# Patient Record
Sex: Female | Born: 1945 | Race: White | Hispanic: No | Marital: Single | Smoking: Former smoker
Health system: Southern US, Community
[De-identification: ages and names within clinical notes are randomized; demographics above are authoritative.]

---

## 1970-06-02 HISTORY — PX: BREAST EXCISIONAL BIOPSY: SUR124

## 1978-06-02 HISTORY — PX: CERVICAL CONIZATION W/BX: SHX1330

## 2005-11-15 ENCOUNTER — Emergency Department (HOSPITAL_COMMUNITY): Admission: EM | Admit: 2005-11-15 | Discharge: 2005-11-15 | Payer: Self-pay | Admitting: *Deleted

## 2006-03-18 ENCOUNTER — Other Ambulatory Visit: Admission: RE | Admit: 2006-03-18 | Discharge: 2006-03-18 | Payer: Self-pay | Admitting: Gynecology

## 2006-04-01 ENCOUNTER — Encounter: Admission: RE | Admit: 2006-04-01 | Discharge: 2006-04-01 | Payer: Self-pay | Admitting: Gynecology

## 2007-03-18 ENCOUNTER — Other Ambulatory Visit: Admission: RE | Admit: 2007-03-18 | Discharge: 2007-03-18 | Payer: Self-pay | Admitting: Gynecology

## 2008-04-06 ENCOUNTER — Ambulatory Visit (HOSPITAL_COMMUNITY): Admission: RE | Admit: 2008-04-06 | Discharge: 2008-04-06 | Payer: Self-pay | Admitting: Gynecology

## 2011-03-27 ENCOUNTER — Other Ambulatory Visit (HOSPITAL_COMMUNITY): Payer: Self-pay | Admitting: Gynecology

## 2011-03-27 DIAGNOSIS — Z1231 Encounter for screening mammogram for malignant neoplasm of breast: Secondary | ICD-10-CM

## 2011-04-23 ENCOUNTER — Ambulatory Visit (HOSPITAL_COMMUNITY): Payer: Self-pay | Attending: Gynecology

## 2011-10-17 ENCOUNTER — Encounter (HOSPITAL_COMMUNITY): Payer: Self-pay | Admitting: Emergency Medicine

## 2011-10-17 ENCOUNTER — Inpatient Hospital Stay (HOSPITAL_COMMUNITY)
Admission: EM | Admit: 2011-10-17 | Discharge: 2011-10-19 | DRG: 866 | Disposition: A | Discharge status: Home / Self Care | Payer: Self-pay | Attending: Internal Medicine | Admitting: Internal Medicine

## 2011-10-17 DIAGNOSIS — R21 Rash and other nonspecific skin eruption: Secondary | ICD-10-CM

## 2011-10-17 DIAGNOSIS — D696 Thrombocytopenia, unspecified: Secondary | ICD-10-CM | POA: Diagnosis present

## 2011-10-17 DIAGNOSIS — N179 Acute kidney failure, unspecified: Secondary | ICD-10-CM | POA: Diagnosis present

## 2011-10-17 DIAGNOSIS — D72829 Elevated white blood cell count, unspecified: Secondary | ICD-10-CM | POA: Diagnosis present

## 2011-10-17 DIAGNOSIS — R7401 Elevation of levels of liver transaminase levels: Secondary | ICD-10-CM | POA: Diagnosis present

## 2011-10-17 DIAGNOSIS — M109 Gout, unspecified: Secondary | ICD-10-CM | POA: Diagnosis present

## 2011-10-17 DIAGNOSIS — N289 Disorder of kidney and ureter, unspecified: Secondary | ICD-10-CM

## 2011-10-17 DIAGNOSIS — B09 Unspecified viral infection characterized by skin and mucous membrane lesions: Principal | ICD-10-CM | POA: Diagnosis present

## 2011-10-17 DIAGNOSIS — B9789 Other viral agents as the cause of diseases classified elsewhere: Secondary | ICD-10-CM | POA: Diagnosis present

## 2011-10-17 DIAGNOSIS — R7402 Elevation of levels of lactic acid dehydrogenase (LDH): Secondary | ICD-10-CM | POA: Diagnosis present

## 2011-10-17 DIAGNOSIS — B349 Viral infection, unspecified: Secondary | ICD-10-CM | POA: Diagnosis present

## 2011-10-17 DIAGNOSIS — I959 Hypotension, unspecified: Secondary | ICD-10-CM | POA: Diagnosis present

## 2011-10-17 DIAGNOSIS — E871 Hypo-osmolality and hyponatremia: Secondary | ICD-10-CM | POA: Diagnosis present

## 2011-10-17 LAB — COMPREHENSIVE METABOLIC PANEL
AST: 103 U/L — ABNORMAL HIGH (ref 0–37)
Albumin: 3 g/dL — ABNORMAL LOW (ref 3.5–5.2)
BUN: 64 mg/dL — ABNORMAL HIGH (ref 6–23)
CO2: 23 mEq/L (ref 19–32)
Calcium: 8.8 mg/dL (ref 8.4–10.5)
Creatinine, Ser: 2.04 mg/dL — ABNORMAL HIGH (ref 0.50–1.10)
GFR calc non Af Amer: 25 mL/min — ABNORMAL LOW (ref >90)

## 2011-10-17 LAB — PROTIME-INR
INR: 0.81 (ref 0.00–1.49)
Prothrombin Time: 11.4 seconds — ABNORMAL LOW (ref 11.6–15.2)

## 2011-10-17 LAB — URINALYSIS, ROUTINE W REFLEX MICROSCOPIC
Leukocytes, UA: NEGATIVE
Specific Gravity, Urine: 1.013 (ref 1.005–1.030)
Urobilinogen, UA: 0.2 mg/dL (ref 0.0–1.0)

## 2011-10-17 LAB — URINE MICROSCOPIC-ADD ON

## 2011-10-17 LAB — DIFFERENTIAL
Basophils Absolute: 0 10*3/uL (ref 0.0–0.1)
Lymphs Abs: 2.2 10*3/uL (ref 0.7–4.0)
Monocytes Absolute: 1.2 10*3/uL — ABNORMAL HIGH (ref 0.1–1.0)

## 2011-10-17 LAB — CBC
MCH: 30.7 pg (ref 26.0–34.0)
MCV: 84.6 fL (ref 78.0–100.0)
Platelets: 47 10*3/uL — ABNORMAL LOW (ref 150–400)
RDW: 14.1 % (ref 11.5–15.5)

## 2011-10-17 MED ORDER — SODIUM CHLORIDE 0.9 % IV BOLUS (SEPSIS)
500.0000 mL | Freq: Once | INTRAVENOUS | Status: AC
  Administered 2011-10-17: 500 mL via INTRAVENOUS

## 2011-10-17 NOTE — ED Notes (Signed)
[facility] NP bedside 

## 2011-10-17 NOTE — ED Provider Notes (Signed)
History     CSN: [id number]  Arrival date & time 10/17/11  1757   First MD Initiated Contact with Patient 10/17/11 2116      Chief Complaint  Patient presents with  . Fatigue    (Consider location/radiation/quality/duration/timing/severity/associated sxs/prior treatment) HPI Comments: That she's had 4 days of generalized myalgias, low-grade headache, weakness, nausea, and vomiting on Tuesday, diarrhea on Wednesday Wednesday evening.  She also had a syncopal episode while standing at the bathroom, state hitting her knee on the floor with continued symptoms, as well as a generalized non- puretic rash, including palms and soles, slightly injected.  She has slight photophobia.  Denies Tick exposure  The history is provided by the patient.    History reviewed. No pertinent past medical history.  History reviewed. No pertinent past surgical history.  Family History  Problem Relation Age of Onset  . Cancer Mother   . Cancer Father     History  Substance Use Topics  . Smoking status: Never Smoker   . Smokeless tobacco: Not on file  . Alcohol Use: 1.2 oz/week    2 Glasses of wine per week    OB History    Grav Para Term Preterm Abortions TAB SAB Ect Mult Living                  Review of Systems  Constitutional: Positive for fatigue. Negative for fever and chills.  HENT: Negative for sore throat.   Eyes: Positive for photophobia and redness. Negative for visual disturbance.  Gastrointestinal: Positive for vomiting and diarrhea.  Genitourinary: Negative for dysuria and flank pain.  Musculoskeletal: Positive for myalgias. Negative for back pain, joint swelling and gait problem.  Skin: Positive for pallor.  Neurological: Positive for weakness, light-headedness and headaches.    Allergies  Review of patient's allergies indicates no known allergies.  Home Medications   Current Outpatient Rx  Name Route Sig Dispense Refill  . VITAMIN D 1000 UNITS PO TABS Oral Take 1,000  Units by mouth daily.    Marland Kitchen NAPROXEN SODIUM 220 MG PO TABS Oral Take 220 mg by mouth 2 (two) times daily with a meal.    . PAROXETINE HCL 10 MG PO TABS Oral Take 10 mg by mouth every morning.      BP 95/61  Pulse 73  Temp(Src) 98.3 F (36.8 C) (Oral)  Resp 14  SpO2 99%  Physical Exam  Constitutional: She is oriented to person, place, and time. She appears well-developed and well-nourished.  HENT:  Head: Normocephalic.  Eyes: Right conjunctiva is injected. Left conjunctiva is injected.  Neck: Normal range of motion.  Cardiovascular: Normal rate.   Pulmonary/Chest: Effort normal.  Abdominal: Soft.  Musculoskeletal: She exhibits no edema and no tenderness.  Neurological: She is alert and oriented to person, place, and time.  Skin: Skin is warm and dry. Rash noted.    ED Course  Procedures (including critical care time)  Labs Reviewed  CBC - Abnormal; Notable for the following:    WBC 14.7 (*)    HCT 35.8 (*)    MCHC 36.3 (*)    Platelets 47 (*) REPEATED TO VERIFY   All other components within normal limits  DIFFERENTIAL - Abnormal; Notable for the following:    Neutro Abs 11.2 (*)    Monocytes Absolute 1.2 (*)    All other components within normal limits  COMPREHENSIVE METABOLIC PANEL - Abnormal; Notable for the following:    Sodium 132 (*)    Glucose,  Bld 105 (*)    BUN 64 (*)    Creatinine, Ser 2.04 (*)    Albumin 3.0 (*)    AST 103 (*)    ALT 154 (*)    GFR calc non Af Amer 25 (*)    GFR calc Af Amer 29 (*)    All other components within normal limits  URINALYSIS, ROUTINE W REFLEX MICROSCOPIC - Abnormal; Notable for the following:    APPearance CLOUDY (*)    Hgb urine dipstick SMALL (*)    All other components within normal limits  PROTIME-INR - Abnormal; Notable for the following:    Prothrombin Time 11.4 (*)    All other components within normal limits  URINE MICROSCOPIC-ADD ON   No results found.   No diagnosis found.    MDM  ITP verses viral  illness        Arman Filter, NP 10/18/11 0013  Arman Filter, NP 10/18/11 [phone]

## 2011-10-17 NOTE — ED Notes (Signed)
Lab bedside.

## 2011-10-17 NOTE — ED Notes (Signed)
Pt states she was sent here from her dr office with a low platelet count  Pt states she went to her dr today   Pt states on Tuesday she had vomiting and Wednesday she had diarrhea and states she had a syncopal episode Wed night  Pt states she has been weak and tired with a mild headache all week  Pt states she has a rash all over her body   Pt states they did labs and her plt count was 38  Pt has copy of her labwork and summary of her office visit with her

## 2011-10-17 NOTE — ED Notes (Signed)
MD at bedside. 

## 2011-10-18 ENCOUNTER — Emergency Department (HOSPITAL_COMMUNITY): Payer: Self-pay

## 2011-10-18 ENCOUNTER — Encounter (HOSPITAL_COMMUNITY): Payer: Self-pay | Admitting: Internal Medicine

## 2011-10-18 DIAGNOSIS — D7289 Other specified disorders of white blood cells: Secondary | ICD-10-CM

## 2011-10-18 DIAGNOSIS — B09 Unspecified viral infection characterized by skin and mucous membrane lesions: Secondary | ICD-10-CM

## 2011-10-18 DIAGNOSIS — I959 Hypotension, unspecified: Secondary | ICD-10-CM | POA: Diagnosis present

## 2011-10-18 DIAGNOSIS — E871 Hypo-osmolality and hyponatremia: Secondary | ICD-10-CM | POA: Diagnosis present

## 2011-10-18 DIAGNOSIS — D696 Thrombocytopenia, unspecified: Secondary | ICD-10-CM | POA: Diagnosis present

## 2011-10-18 DIAGNOSIS — D72829 Elevated white blood cell count, unspecified: Secondary | ICD-10-CM | POA: Diagnosis present

## 2011-10-18 DIAGNOSIS — N179 Acute kidney failure, unspecified: Secondary | ICD-10-CM | POA: Diagnosis present

## 2011-10-18 DIAGNOSIS — R21 Rash and other nonspecific skin eruption: Secondary | ICD-10-CM

## 2011-10-18 LAB — DIFFERENTIAL
Eosinophils Relative: 0 % (ref 0–5)
Lymphs Abs: 1.5 10*3/uL (ref 0.7–4.0)
Monocytes Relative: 6 % (ref 3–12)
Neutrophils Relative %: 83 % — ABNORMAL HIGH (ref 43–77)

## 2011-10-18 LAB — CBC
Hemoglobin: 13.1 g/dL (ref 12.0–15.0)
MCV: 83.8 fL (ref 78.0–100.0)
Platelets: 48 10*3/uL — ABNORMAL LOW (ref 150–400)
RBC: 4.33 MIL/uL (ref 3.87–5.11)
WBC: 13.9 10*3/uL — ABNORMAL HIGH (ref 4.0–10.5)

## 2011-10-18 LAB — COMPREHENSIVE METABOLIC PANEL
Albumin: 3.1 g/dL — ABNORMAL LOW (ref 3.5–5.2)
BUN: 53 mg/dL — ABNORMAL HIGH (ref 6–23)
Calcium: 8.8 mg/dL (ref 8.4–10.5)
Chloride: 98 mEq/L (ref 96–112)
Creatinine, Ser: 1.62 mg/dL — ABNORMAL HIGH (ref 0.50–1.10)
Total Bilirubin: 0.3 mg/dL (ref 0.3–1.2)
Total Protein: 6.6 g/dL (ref 6.0–8.3)

## 2011-10-18 LAB — HEPATITIS B SURFACE ANTIGEN: Hepatitis B Surface Ag: NEGATIVE

## 2011-10-18 LAB — GLUCOSE, CAPILLARY: Glucose-Capillary: 93 mg/dL (ref 70–99)

## 2011-10-18 LAB — HEPATITIS B SURFACE ANTIBODY,QUALITATIVE: Hep B S Ab: NEGATIVE

## 2011-10-18 LAB — LEGIONELLA ANTIGEN, URINE

## 2011-10-18 LAB — RPR: RPR Ser Ql: NONREACTIVE

## 2011-10-18 MED ORDER — DOXYCYCLINE HYCLATE 50 MG PO CAPS
50.0000 mg | ORAL_CAPSULE | Freq: Two times a day (BID) | ORAL | Status: DC
  Filled 2011-10-18 (×3): qty 1

## 2011-10-18 MED ORDER — DOXYCYCLINE HYCLATE 100 MG PO TABS
50.0000 mg | ORAL_TABLET | Freq: Two times a day (BID) | ORAL | Status: DC
  Administered 2011-10-18: 50 mg via ORAL
  Filled 2011-10-18 (×2): qty 0.5

## 2011-10-18 MED ORDER — ACETAMINOPHEN 650 MG RE SUPP: 650.0000 mg | Freq: Four times a day (QID) | RECTAL | Status: DC | PRN

## 2011-10-18 MED ORDER — SENNA 8.6 MG PO TABS
1.0000 | ORAL_TABLET | Freq: Two times a day (BID) | ORAL | Status: DC
  Administered 2011-10-18 – 2011-10-19 (×3): 8.6 mg via ORAL
  Filled 2011-10-18 (×3): qty 1

## 2011-10-18 MED ORDER — DOXYCYCLINE HYCLATE 100 MG PO TABS
100.0000 mg | ORAL_TABLET | Freq: Two times a day (BID) | ORAL | Status: DC
  Administered 2011-10-18 – 2011-10-19 (×3): 100 mg via ORAL
  Filled 2011-10-18 (×3): qty 1

## 2011-10-18 MED ORDER — PAROXETINE HCL 10 MG PO TABS
10.0000 mg | ORAL_TABLET | Freq: Every day | ORAL | Status: DC
  Administered 2011-10-18 – 2011-10-19 (×2): 10 mg via ORAL
  Filled 2011-10-18 (×5): qty 1

## 2011-10-18 MED ORDER — SODIUM CHLORIDE 0.9 % IV SOLN
INTRAVENOUS | Status: DC
  Administered 2011-10-18 – 2011-10-19 (×2): via INTRAVENOUS
  Filled 2011-10-18 (×3): qty 1000

## 2011-10-18 MED ORDER — ACETAMINOPHEN 325 MG PO TABS
650.0000 mg | ORAL_TABLET | Freq: Four times a day (QID) | ORAL | Status: DC | PRN
  Administered 2011-10-19: 650 mg via ORAL
  Filled 2011-10-18: qty 2

## 2011-10-18 MED ORDER — ONDANSETRON HCL 4 MG/2ML IJ SOLN: 4.0000 mg | Freq: Four times a day (QID) | INTRAMUSCULAR | Status: DC | PRN

## 2011-10-18 MED ORDER — SODIUM CHLORIDE 0.9 % IV SOLN
INTRAVENOUS | Status: DC
  Administered 2011-10-18: 150 mL/h via INTRAVENOUS
  Administered 2011-10-18: 02:00:00 via INTRAVENOUS

## 2011-10-18 MED ORDER — ONDANSETRON HCL 4 MG PO TABS: 4.0000 mg | ORAL_TABLET | Freq: Four times a day (QID) | ORAL | Status: DC | PRN

## 2011-10-18 MED ORDER — HEPARIN SODIUM (PORCINE) 5000 UNIT/ML IJ SOLN
5000.0000 [IU] | Freq: Three times a day (TID) | INTRAMUSCULAR | Status: DC
  Administered 2011-10-18 – 2011-10-19 (×5): 5000 [IU] via SUBCUTANEOUS
  Filled 2011-10-18 (×7): qty 1

## 2011-10-18 MED ORDER — DOCUSATE SODIUM 100 MG PO CAPS
100.0000 mg | ORAL_CAPSULE | Freq: Two times a day (BID) | ORAL | Status: DC
  Administered 2011-10-18 – 2011-10-19 (×3): 100 mg via ORAL
  Filled 2011-10-18 (×5): qty 1

## 2011-10-18 NOTE — Progress Notes (Signed)
Subjective: Patient reports that her symptoms started about 4 days ago first with myalgias and followed by nausea and vomiting and diarrhea the next day then a diffuse rash starting on her extremities. She states that the rash was nonpruritic and never really extended to the trunk. She went to the urgent care was sent over to the emergency room if it was noted that her platelet count was low. The patient denies any fevers. She [place] but denies noticing any ticks. Objective: Filed Vitals:   10/18/11 0232 10/18/11 0505 10/18/11 0540 10/18/11 1509  BP: 107/69 95/54 104/58 94/44  Pulse: 70 81  56  Temp: 98.2 F (36.8 C) 98.4 F (36.9 C)  97.8 F (36.6 C)  TempSrc: Oral Oral  Oral  Resp: 18 16  16   Height: 5\' 4"  (1.626 m)     Weight: 60.328 kg (133 lb)     SpO2: 96% 95%  100%   Weight change:   Intake/Output Summary (Last 24 hours) at 10/18/11 1705 Last data filed at 10/18/11 1541  Gross per 24 hour  Intake 2238.4 ml  Output    150 ml  Net 2088.4 ml    General: Alert, awake, oriented x3, in no acute distress. Well appearing HEENT: [place]/AT PEER[place], EOMI Neck: Trachea midline,  no masses, no thyromegal,y no JVD, no carotid bruit OROPHARYNX:  Moist, No exudate/ erythema/lesions.  Heart: Regular rate and rhythm, without murmurs, rubs, gallops, PMI non-displaced, no heaves or thrills on palpation.  Lungs: Clear to auscultation, no wheezing or rhonchi noted. No increased vocal fremitus resonant to percussion  Abdomen: Soft, nontender, nondistended, positive bowel sounds, no masses no hepatosplenomegaly noted..  Neuro: No focal neurological deficits noted cranial nerves II through XII grossly intact. Strength functional in bilateral upper and lower extremities. Musculoskeletal: No warm swelling or erythema around joints, no spinal tenderness noted. Psychiatric: Patient alert and oriented x3, good insight and cognition, good recent to remote recall. Skin: Patient  has non-confluent, erythematous, nonpruritic macular rash. The rash is mostly confined to the extremities and the patient reports that the rash is improved since yesterday. The rash is definitely not morbilliform in nature  Lab Results:  Basename 10/18/11 0315 10/17/11 2220  NA 133* 132*  K 3.1* 3.7  CL 98 96  CO2 21 23  GLUCOSE 98 105*  BUN 53* 64*  CREATININE 1.62* 2.04*  CALCIUM 8.8 8.8  MG -- --  PHOS -- --    Basename 10/18/11 0315 10/17/11 2220  AST 89* 103*  ALT 148* 154*  ALKPHOS 87 85  BILITOT 0.3 0.3  PROT 6.6 6.6  ALBUMIN 3.1* 3.0*   No results found for this basename: LIPASE:2,AMYLASE:2 in the last 72 hours  Basename 10/18/11 0315 10/17/11 2220  WBC 13.9* 14.7*  NEUTROABS 11.6* 11.2*  HGB 13.1 13.0  HCT 36.3 35.8*  MCV 83.8 84.6  PLT 48* 47*   No results found for this basename: CKTOTAL:3,CKMB:3,CKMBINDEX:3,TROPONINI:3 in the last 72 hours No components found with this basename: POCBNP:3 No results found for this basename: DDIMER:2 in the last 72 hours No results found for this basename: HGBA1C:2 in the last 72 hours No results found for this basename: CHOL:2,HDL:2,LDLCALC:2,TRIG:2,CHOLHDL:2,LDLDIRECT:2 in the last 72 hours No results found for this basename: TSH,T4TOTAL,FREET3,T3FREE,THYROIDAB in the last 72 hours No results found for this basename: VITAMINB12:2,FOLATE:2,FERRITIN:2,TIBC:2,IRON:2,RETICCTPCT:2 in the last 72 hours  Micro Results: No results found for this or any previous visit (from the past 240 hour(s)).  Studies/Results:  Dg Chest 1 View  10/18/2011  *RADIOLOGY REPORT*  Clinical Data: Leukocytosis, thrombocytopenia  CHEST - 1 VIEW  Comparison: None.  Findings: Cardiomediastinal silhouette is within normal limits. The lungs are clear. No pleural effusion.  No pneumothorax.  No acute osseous abnormality.  IMPRESSION: No acute cardiopulmonary process.  Original Report Authenticated By: Harrel Lemon, M.D.    Medications: I have reviewed  the patient's current medications. Scheduled Meds:   . docusate sodium  100 mg Oral BID  . doxycycline  100 mg Oral Q12H  . heparin  5,000 Units Subcutaneous Q8H  . PARoxetine  10 mg Oral Q breakfast  . senna  1 tablet Oral BID  . sodium chloride  500 mL Intravenous Once  . sodium chloride  500 mL Intravenous Once  . DISCONTD: doxycycline  50 mg Oral Q12H  . DISCONTD: doxycycline  50 mg Oral Q12H   Continuous Infusions:   . sodium chloride 0.9 % 1,000 mL with potassium chloride 40 mEq infusion 75 mL/hr at 10/18/11 1541  . DISCONTD: sodium chloride 150 mL/hr (10/18/11 0913)   PRN Meds:.acetaminophen, acetaminophen, ondansetron (ZOFRAN) IV, ondansetron Assessment/Plan: Patient Active Hospital Problem List:  Viral exanthem (10/18/2011)   Assessment: Per the patient's historical accounts appears that the patient has a viral exanthem. There is no historical data supports a tickborne illness and the patient certainly does not present with a rash indicative of measles. I have asked the internal medicine specialist to see the patient in consultation today. I will also continue the patient on doxycycline 100 mg by mouth twice a day presumptively in the event of possible early ecchymosis. I attribute the thrombocytopenia to the effects of the viral process as well such as MI this both of which are improving.     Hyponatremia (10/18/2011)   Assessment: Secondary to dehydration. Improved with IV fluid    ARF (acute renal failure) (10/18/2011)   Assessment: Patient's acute kidney injury secondary to prerenal state. This is improved with hydration and continues to improve. I suspect with the patient is fully rehydrated her renal function will defer to normal.    Transaminitis (10/18/2011)   Assessment: This is a combination of dehydrated state as well as effects of virus. This is improving we'll continue to follow.     Hypotension (10/18/2011)   Assessment: Improved with IV hydration.     LOS: 1  day

## 2011-10-18 NOTE — ED Notes (Signed)
Dr Angus Palms bedside

## 2011-10-18 NOTE — H&P (Signed)
PCP:  No primary provider on file.   Chief Complaint:  Malaise, diarrhea, rash, low plt count  HPI: 65yoF without major medical history presents with acute onset of viral- type malaise syndrome with achiness, diarrhea/vomiting, and erythematous  macular morbilliform rash, and found to have acute renal failure,  transaminitis, leukocytosis, thrombocytopenia, and relative hypotension.  Pt is excellent historian, states that she is overall healthy and was in  usual state of health including working daily until Tuesday when she  started feeling achey behind her eyes, in her neck, and diffusely in the  joints, similar to a virus or flu. She went home from work to rest and  was profoundly fatigued. Through Wednesday the most prominent symptoms  were profuse watery diarrhea, with concurrent nausea and vomiting but no  abdominal pain. Thursday, she got a bit better, other than having a  syncopal episode. However Friday am she woke up with a diffuse pink,  erythematous rash on her arms and legs for which she presented to UC,  they got labs and noted a low platelet count, and referred to ED.   She has bitten on the right upper lip by a friend's dog last Friday; she  says swelling spread to the right lip with some numbness, but this has  already resolved. She has a cat who bites and scratches her frequently.   In the ED, pt's BP is on the low side in the 90/60's which she states is  low for her, otherwise stable. Labs showed minimal hypoNa 132, renal  64/2.04. AST 103 / ALT 154. WBC 14.7 normal diff. Plts 47. Normal INR.  UA negative. Pt was given 1L of NS.   She has not been sexually active in what sounds like a long time, and  denies any h/o IVDU. She works in the office of an Set designer, at a  computer, and denies exposure to solvents, gases, etc. She has not  travelled in a long time, no 3rd world country exposures. She does work  in the garden a lot, but no known mosquito, tick  bites, no camping or  countryside exposures. She endorses having eaten a possibly undercooked  burger 1-2 weeks ago, but otherwise no strange food exposures, nothing  else raw or seafoods.   ROS: She denies frank fevers, chills, rigors, but does endorse some  diaphoresis. No frank neck stiffness but does endorse some minimal  photobphobia and phonophobia. No headache or confusion. No vision  changes, but has some conjunctival injection. No sore throat. No cough,  SOB, chest pain, palpitations. No dysuria or urinary issues. No other  rashes. The joint issues are not prominent, denies swelling, pain. She  has had poor PO intake.    History reviewed. No pertinent past medical history.  History reviewed. No pertinent past surgical history.  Medications:  HOME MEDS: Only takes prozac on daily basis Prior to Admission medications   Medication Sig Start Date End Date Taking? Authorizing Provider  cholecalciferol (VITAMIN D) 1000 UNITS tablet Take 1,000 Units by mouth daily.   Yes Historical Provider, MD  naproxen sodium (ALEVE) 220 MG tablet Take 220 mg by mouth 2 (two) times daily with a meal.   Yes Historical Provider, MD  PARoxetine (PAXIL) 10 MG tablet Take 10 mg by mouth every morning.   Yes Historical Provider, MD    Allergies:  No Known Allergies  Social History:   reports that she has never smoked. She does not have any smokeless tobacco history on  file. She reports that she drinks about 1.2 ounces of alcohol per week. She reports that she does not use illicit drugs. She lives at home and is still working in a [employer]. She is active, gardens, and doesn't use cane or walker. She is not sexually active. She smoked remotely but not heavily. Drinks 3 glasses of wine a night, occasional MJ, but no IVDU.   Family History: Family History  Problem Relation Age of Onset  . Cancer Mother   . Cancer Father     Physical Exam: Filed Vitals:   10/18/11 0115 10/18/11 0130  10/18/11 0145 10/18/11 0207  BP:    100/66  Pulse: 82   72  Temp:    98 F (36.7 C)  TempSrc:    Oral  Resp: 26 15 15 16   SpO2: 100%   98%   Blood pressure 100/66, pulse 72, temperature 98 F (36.7 C), temperature source Oral, resp. rate 16, SpO2 98.00%.  Gen: Middle aged but still young and healthy appearing F in no distress,  very pleasant and reliable historian, calm, not toxic or really even ill  appearing.  HEENT: Diffuse facial erythema, ruddiness on her cheeks, nose, extending  down to neck with diffuse blanching erythema and some telangiectasias  noted. Pupils round, reactive, equal, EOMI, sclera clear, conjunctivae a  bit injected. No nystagmus. Mouth moist and normal, no tonsillar  exudates, OP quite normal appearing. Left buccal mucosa with benign  linear gray line but also wtih a punctate gray dot above and below this  line. Right side of upper lip with a clear scratch mark, but otherwise  pretty normal appearing.  Neck: No cervical or supraclavicular LAD noted. Supple, normal Lungs: CTAB no w/c/r, good air movement, no adventitious sounds, no  increased WOB or accessory muscles, normal exam Heart: S1/2 clear, regular, and not tachy. No murmurs, no gallops or  heaves, normal exam Abd: Soft, not tender, no facial grimacing, liver edge normal, no  splenomegaly noted to deep palpation, Benign overall, bowel sounds  positive Extrem: Wamr, perfusing normally, radials easily palpable, no BLE edema  noted, normal muscle bulk and tone Neuro: Alert, attentive, conversant, CN 2-12 totally intact, face  symmetric, speech clear and fluent without aphasia, moves extremities  sponteneously with normal strength, grossly normal throughout Skin: Most prominently on her proximal BUE's and proximal thighs  bilaterally is a blanching, pink, reticular erythematous macular rash.  On the thighs they are more round and reticular but on the arms it's  more extensive and confluent. The back  is completely covered with the  rash and totally confluent. Borders are sometimes indistince, sometimes  very sharp. Legs with some small petechiae but this is not prominent.  These are not purpuric, there are no bullae. It looks akin to a drug  reaction or viral exanthem, i.e. "morbilliform." There are no oral  ulcerations I can appreciate.    Labs & Imaging Results for orders placed during the hospital encounter of 10/17/11 (from the past 48 hour(s))  URINALYSIS, ROUTINE W REFLEX MICROSCOPIC     Status: Abnormal   Collection Time   10/17/11  9:38 PM      Component Value Range Comment   Color, Urine YELLOW  YELLOW     APPearance CLOUDY (*) CLEAR     Specific Gravity, Urine 1.013  1.005 - 1.030     pH 6.0  5.0 - 8.0     Glucose, UA NEGATIVE  NEGATIVE (mg/dL)    Hgb  urine dipstick SMALL (*) NEGATIVE     Bilirubin Urine NEGATIVE  NEGATIVE     Ketones, ur NEGATIVE  NEGATIVE (mg/dL)    Protein, ur NEGATIVE  NEGATIVE (mg/dL)    Urobilinogen, UA 0.2  0.0 - 1.0 (mg/dL)    Nitrite NEGATIVE  NEGATIVE     Leukocytes, UA NEGATIVE  NEGATIVE    URINE MICROSCOPIC-ADD ON     Status: Normal   Collection Time   10/17/11  9:38 PM      Component Value Range Comment   Squamous Epithelial / LPF RARE  RARE     WBC, UA 0-2  <3 (WBC/hpf)    RBC / HPF 0-2  <3 (RBC/hpf)    Urine-Other AMORPHOUS URATES/PHOSPHATES     CBC     Status: Abnormal   Collection Time   10/17/11 10:20 PM      Component Value Range Comment   WBC 14.7 (*) 4.0 - 10.5 (K/uL)    RBC 4.23  3.87 - 5.11 (MIL/uL)    Hemoglobin 13.0  12.0 - 15.0 (g/dL)    HCT 16.1 (*) 09.6 - 46.0 (%)    MCV 84.6  78.0 - 100.0 (fL)    MCH 30.7  26.0 - 34.0 (pg)    MCHC 36.3 (*) 30.0 - 36.0 (g/dL)    RDW 04.5  40.9 - 81.1 (%)    Platelets 47 (*) 150 - 400 (K/uL) REPEATED TO VERIFY  DIFFERENTIAL     Status: Abnormal   Collection Time   10/17/11 10:20 PM      Component Value Range Comment   Neutrophils Relative 76  43 - 77 (%)    Lymphocytes Relative  15  12 - 46 (%)    Monocytes Relative 8  3 - 12 (%)    Eosinophils Relative 1  0 - 5 (%)    Basophils Relative 0  0 - 1 (%)    Neutro Abs 11.2 (*) 1.7 - 7.7 (K/uL)    Lymphs Abs 2.2  0.7 - 4.0 (K/uL)    Monocytes Absolute 1.2 (*) 0.1 - 1.0 (K/uL)    Eosinophils Absolute 0.1  0.0 - 0.7 (K/uL)    Basophils Absolute 0.0  0.0 - 0.1 (K/uL)    Smear Review PLATELET COUNT CONFIRMED BY SMEAR   LARGE PLATELETS PRESENT  COMPREHENSIVE METABOLIC PANEL     Status: Abnormal   Collection Time   10/17/11 10:20 PM      Component Value Range Comment   Sodium 132 (*) 135 - 145 (mEq/L)    Potassium 3.7  3.5 - 5.1 (mEq/L)    Chloride 96  96 - 112 (mEq/L)    CO2 23  19 - 32 (mEq/L)    Glucose, Bld 105 (*) 70 - 99 (mg/dL)    BUN 64 (*) 6 - 23 (mg/dL)    Creatinine, Ser 9.14 (*) 0.50 - 1.10 (mg/dL)    Calcium 8.8  8.4 - 10.5 (mg/dL)    Total Protein 6.6  6.0 - 8.3 (g/dL)    Albumin 3.0 (*) 3.5 - 5.2 (g/dL)    AST 782 (*) 0 - 37 (U/L)    ALT 154 (*) 0 - 35 (U/L)    Alkaline Phosphatase 85  39 - 117 (U/L)    Total Bilirubin 0.3  0.3 - 1.2 (mg/dL)    GFR calc non Af Amer 25 (*) >90 (mL/min)    GFR calc Af Amer 29 (*) >90 (mL/min)   PROTIME-INR  Status: Abnormal   Collection Time   10/17/11 10:20 PM      Component Value Range Comment   Prothrombin Time 11.4 (*) 11.6 - 15.2 (seconds)    INR 0.81  0.00 - 1.49     Dg Chest 1 View  10/18/2011  *RADIOLOGY REPORT*  Clinical Data: Leukocytosis, thrombocytopenia  CHEST - 1 VIEW  Comparison: None.  Findings: Cardiomediastinal silhouette is within normal limits. The lungs are clear. No pleural effusion.  No pneumothorax.  No acute osseous abnormality.  IMPRESSION: No acute cardiopulmonary process.  Original Report Authenticated By: Harrel Lemon, M.D.    ECG: NSR 61 bpm, normal axis, normal P and PR, no Q waves, narrow QRS,  no ST deviations, normal T waves. Normal ECG.    Impression Present on Admission:  .Morbilliform  rash .Thrombocytopenia .Leukocytosis .Hyponatremia .ARF (acute renal failure) .Transaminitis .Hypotension  65yoF without major medical history presents with acute onset of viral- type malaise syndrome with achiness, diarrhea/vomiting, and erythematous  macular morbilliform rash, and found to have acute renal failure,  transaminitis, leukocytosis, thrombocytopenia, and relative hypotension.   1. Morbilliform rash with end-organ damage: Overall, this seems  infectious in nature given the appearance of the rash. Drug reaction is  in the DDx; her only med prozac is longstanding but she did recently  take some NSAID's. Bacterial vs viral exanthem seems the most likely to  me.   The dog bite last week is concerning, she could have staph or strep  bacteremia, i.e. a toxic-shock like syndrome. Measles has been reported  in the community recently, and she has a gray spot on her buccal mucosa  (? [place]) and minimal conjuctivitis, however no coryza or cough.  Legionella (diarrhea, minimal hypoNa, and transaminitis, however no  symtpoms of PNA). Given thrombocytopenia, also consider ehrlichiosis and  anaplasmosis. Also [facility] Spotted Fever, and Southern tick- associated rash illness (STARI) -- she has many features of RMSF but the  rash is not classic in location. Given transaminitis, considered  leptospirosis. Mycoplasma infection, parvovirus and non-infectious  etiologies of Adult onset Still's disease, DRESS syndrome,  hypersensitivity vasculitis, and serum sicknesses are considered.   Others considered but less likely: Scarlet fever (No evidence of group A  strep throat), EBV (no pharyngitis, LAD makes this less likely), HIV  (not sexually active at all), syphilis.   - Consider ID consultation in the am  - Needs a differential, peripheral smear - Blood culture x2, sputum culture - CXR, EKG - UCx, legionella urinary Ag  - ANA, RPR, monospot test, parvovirus and mycoplasma  antibodies, measles  IgM - Hepatitis IgM, HepB testing, HepC testing; consider RUQ ultrasound - Ehrlichia antibody panel. ? no testing for RMSF or leptospirosis - Starting empiric doxycycline for arthropod bourne illnesses, but hold  on other broad spectrum ABx for now, given no clear Dx  - If w/u negative, consider LP? Not overwhelmed for this at present though  2. Acute renal failure: Although no clear baseline, pt is healthy and no  reason to suspect any element of CKD.  - Urine lytes for FeNa, IVF's and trend BMET  3. HypoTN: Mild; she does not look floridly shocked to me at present. As  discussed with her, other than doxycycline for now, I am going to hold  on broad spectrum ABx in order to increase diagnostic yield, but we will  have a very low threshold to start if she decompensates. She actually  looks quite well for now.  4. Thrombocytopenia:  No culprit meds. No anemia or elevated INR to  suggest a consumptive coagulopathy. Get smear, w/u as above.   5. HypoNa: Mild at present. IVF's.   SubQ heparin Regular bed given current overall stability an SBP now >100, low threshold to move SDU if more unstable.  [facility] team 5 Presumed full code    Other plans as per orders.  Prentiss Hammett 10/18/2011, 2:20 AM

## 2011-10-18 NOTE — Consult Note (Signed)
Infectious Diseases Initial Consultation  Reason for Consultation:  Viral illness with associated rash and thrombocytopenia   HPI: Eileen Stephenson is a 66 y.o. female with no significant past medical history presents with a 4 day history of flu like symptoms, predominantly myalgias, arthralgias, headache and fatigue. She noted having a day's worth of n/v followed by episode of diarrhea. She subsequently have GLF with an episode orthostasis/lightheadedness when she got out of bed to go to the bathroom. On the day of admit, she noted to have diffuse non-pruritic, macular papular rash that originally started on her legs and arms then involving her torso. Her rash does involve the palms and soles.She denies having fever, nor sore throat, nor cough. She denies any recent sick contacts. No travel. Has been outdoors, but not hiking in the woods. She denies having any recent tick exposure.  She presented to her doctor's office for evaluation who found that she had lab abn of plt 38K and was referred to ED for further evaluation.  On presentation to the ED, she was afebrile, normotensive, nor tachycardic but was found to have leukocytosis, 14.7 but also thrombocytopenia of 50K,pre-renal azotemia with cr 2.04 and transaminases 2.5X ULN. She was started empirically on doxycycline to cover possible atypical pneumonia vs. Rickettsial/RMSF/erlichia.   She states that she was bit on the lip by a dog last week, but the lesion has healed well. She states that her rash is improving. She is feeling somewhat better in the last 24hrs.  History reviewed. No pertinent past medical history.  Allergies: No Known Allergies  Current antibiotics: Doxycycline #2  MEDICATIONS:    . docusate sodium  100 mg Oral BID  . doxycycline  100 mg Oral Q12H  . heparin  5,000 Units Subcutaneous Q8H  . PARoxetine  10 mg Oral Q breakfast  . senna  1 tablet Oral BID  . sodium chloride  500 mL Intravenous Once  . sodium chloride  500 mL  Intravenous Once  . DISCONTD: doxycycline  50 mg Oral Q12H  . DISCONTD: doxycycline  50 mg Oral Q12H    History  Substance Use Topics  . Smoking status: Never Smoker   . Smokeless tobacco: Not on file  . Alcohol Use: 1.2 oz/week    2 Glasses of wine per week  - she has an office job/ works an Set designer. Planning on going to [employer] early next week.   Family History  Problem Relation Age of Onset  . Breast cancer Mother   . Cancer Father   . Leukemia Sister     Sister passed away at 6yo   Review of Systems  10 point review of systems is otherwise negative other than what is mentioned in HPI.  OBJECTIVE: Temp:  [97.8 F (36.6 C)-99 F (37.2 C)] 99 F (37.2 C) (05/18 2053) Pulse Rate:  [56-82] 76  (05/18 2053) Resp:  [15-26] 16  (05/18 2053) BP: (94-107)/(44-74) 103/58 mmHg (05/18 2053) SpO2:  [95 %-100 %] 98 % (05/18 2053) Weight:  [133 lb (60.328 kg)] 133 lb (60.328 kg) (05/18 0232) BP 103/58  Pulse 76  Temp(Src) 99 F (37.2 C) (Oral)  Resp 16  Ht 5\' 4"  (1.626 m)  Wt 133 lb (60.328 kg)  BMI 22.83 kg/m2  SpO2 98%  General Appearance:    Alert, cooperative, no distress, appears stated age  Head:    Normocephalic, without obvious abnormality, atraumatic  Eyes:    PERRL, conjunctiva/corneas clear, EOM's intact, fundi  benign, both eyes  Ears:    Normal TM's and external ear canals, both ears  Nose:   Nares normal, septum midline, mucosa normal, no drainage    or sinus tenderness  Throat:   Upper lip has healed laceration, mucosa, and tongue normal; teeth and gums normal  Neck:   Supple, symmetrical, trachea midline, no adenopathy;    thyroid:  no enlargement/tenderness/nodules; no carotid   bruit or JVD  Back:     Symmetric, no curvature, ROM normal, no CVA tenderness  Lungs:     Clear to auscultation bilaterally, respirations unlabored  Chest Wall:    No tenderness or deformity   Heart:    Regular rate and rhythm, S1 and S2 normal, no murmur, rub    or gallop     Abdomen:     Soft, non-tender, bowel sounds active all four quadrants,    no masses, no organomegaly        Extremities:   Extremities normal, atraumatic, no cyanosis or edema  Pulses:   2+ and symmetric all extremities  Skin:   Macular papular rash involving legs>arms> torso. No head involvement. Palms and soles of hands have coin like lesions.  Lymph nodes:   Cervical, supraclavicular, and axillary nodes normal  Neurologic:   CNII-XII intact, normal strength, sensation and reflexes    throughout   LABS: Results for orders placed during the hospital encounter of 10/17/11 (from the past 48 hour(s))  URINALYSIS, ROUTINE W REFLEX MICROSCOPIC     Status: Abnormal   Collection Time   10/17/11  9:38 PM      Component Value Range Comment   Color, Urine YELLOW  YELLOW     APPearance CLOUDY (*) CLEAR     Specific Gravity, Urine 1.013  1.005 - 1.030     pH 6.0  5.0 - 8.0     Glucose, UA NEGATIVE  NEGATIVE (mg/dL)    Hgb urine dipstick SMALL (*) NEGATIVE     Bilirubin Urine NEGATIVE  NEGATIVE     Ketones, ur NEGATIVE  NEGATIVE (mg/dL)    Protein, ur NEGATIVE  NEGATIVE (mg/dL)    Urobilinogen, UA 0.2  0.0 - 1.0 (mg/dL)    Nitrite NEGATIVE  NEGATIVE     Leukocytes, UA NEGATIVE  NEGATIVE    URINE MICROSCOPIC-ADD ON     Status: Normal   Collection Time   10/17/11  9:38 PM      Component Value Range Comment   Squamous Epithelial / LPF RARE  RARE     WBC, UA 0-2  <3 (WBC/hpf)    RBC / HPF 0-2  <3 (RBC/hpf)    Urine-Other AMORPHOUS URATES/PHOSPHATES     LEGIONELLA ANTIGEN, URINE     Status: Normal   Collection Time   10/17/11  9:38 PM      Component Value Range Comment   Specimen Description URINE, CLEAN CATCH      Special Requests NONE      Legionella Antigen, Urine Negative for Legionella pneumophilia serogroup 1      Report Status 10/18/2011 FINAL     CBC     Status: Abnormal   Collection Time   10/17/11 10:20 PM      Component Value Range Comment   WBC 14.7 (*) 4.0 -  10.5 (K/uL)    RBC 4.23  3.87 - 5.11 (MIL/uL)    Hemoglobin 13.0  12.0 - 15.0 (g/dL)    HCT 78.2 (*) 95.6 - 46.0 (%)    MCV  84.6  78.0 - 100.0 (fL)    MCH 30.7  26.0 - 34.0 (pg)    MCHC 36.3 (*) 30.0 - 36.0 (g/dL)    RDW 02.7  25.3 - 66.4 (%)    Platelets 47 (*) 150 - 400 (K/uL) REPEATED TO VERIFY  DIFFERENTIAL     Status: Abnormal   Collection Time   10/17/11 10:20 PM      Component Value Range Comment   Neutrophils Relative 76  43 - 77 (%)    Lymphocytes Relative 15  12 - 46 (%)    Monocytes Relative 8  3 - 12 (%)    Eosinophils Relative 1  0 - 5 (%)    Basophils Relative 0  0 - 1 (%)    Neutro Abs 11.2 (*) 1.7 - 7.7 (K/uL)    Lymphs Abs 2.2  0.7 - 4.0 (K/uL)    Monocytes Absolute 1.2 (*) 0.1 - 1.0 (K/uL)    Eosinophils Absolute 0.1  0.0 - 0.7 (K/uL)    Basophils Absolute 0.0  0.0 - 0.1 (K/uL)    Smear Review PLATELET COUNT CONFIRMED BY SMEAR   LARGE PLATELETS PRESENT  COMPREHENSIVE METABOLIC PANEL     Status: Abnormal   Collection Time   10/17/11 10:20 PM      Component Value Range Comment   Sodium 132 (*) 135 - 145 (mEq/L)    Potassium 3.7  3.5 - 5.1 (mEq/L)    Chloride 96  96 - 112 (mEq/L)    CO2 23  19 - 32 (mEq/L)    Glucose, Bld 105 (*) 70 - 99 (mg/dL)    BUN 64 (*) 6 - 23 (mg/dL)    Creatinine, Ser 4.03 (*) 0.50 - 1.10 (mg/dL)    Calcium 8.8  8.4 - 10.5 (mg/dL)    Total Protein 6.6  6.0 - 8.3 (g/dL)    Albumin 3.0 (*) 3.5 - 5.2 (g/dL)    AST 474 (*) 0 - 37 (U/L)    ALT 154 (*) 0 - 35 (U/L)    Alkaline Phosphatase 85  39 - 117 (U/L)    Total Bilirubin 0.3  0.3 - 1.2 (mg/dL)    GFR calc non Af Amer 25 (*) >90 (mL/min)    GFR calc Af Amer 29 (*) >90 (mL/min)   PROTIME-INR     Status: Abnormal   Collection Time   10/17/11 10:20 PM      Component Value Range Comment   Prothrombin Time 11.4 (*) 11.6 - 15.2 (seconds)    INR 0.81  0.00 - 1.49    RPR     Status: Normal   Collection Time   10/18/11  3:15 AM      Component Value Range Comment   RPR NON REACTIVE  NON  REACTIVE    HEPATITIS B SURFACE ANTIBODY     Status: Normal   Collection Time   10/18/11  3:15 AM      Component Value Range Comment   Hep B S Ab NEGATIVE  NEGATIVE    HEPATITIS B SURFACE ANTIGEN     Status: Normal   Collection Time   10/18/11  3:15 AM      Component Value Range Comment   Hepatitis B Surface Ag NEGATIVE  NEGATIVE    HEPATITIS C ANTIBODY (REFLEX)     Status: Normal   Collection Time   10/18/11  3:15 AM      Component Value Range Comment   HCV Ab NEGATIVE  NEGATIVE  COMPREHENSIVE METABOLIC PANEL     Status: Abnormal   Collection Time   10/18/11  3:15 AM      Component Value Range Comment   Sodium 133 (*) 135 - 145 (mEq/L)    Potassium 3.1 (*) 3.5 - 5.1 (mEq/L)    Chloride 98  96 - 112 (mEq/L)    CO2 21  19 - 32 (mEq/L)    Glucose, Bld 98  70 - 99 (mg/dL)    BUN 53 (*) 6 - 23 (mg/dL)    Creatinine, Ser 1.61 (*) 0.50 - 1.10 (mg/dL)    Calcium 8.8  8.4 - 10.5 (mg/dL)    Total Protein 6.6  6.0 - 8.3 (g/dL)    Albumin 3.1 (*) 3.5 - 5.2 (g/dL)    AST 89 (*) 0 - 37 (U/L)    ALT 148 (*) 0 - 35 (U/L)    Alkaline Phosphatase 87  39 - 117 (U/L)    Total Bilirubin 0.3  0.3 - 1.2 (mg/dL)    GFR calc non Af Amer 33 (*) >90 (mL/min)    GFR calc Af Amer 38 (*) >90 (mL/min)   CBC     Status: Abnormal   Collection Time   10/18/11  3:15 AM      Component Value Range Comment   WBC 13.9 (*) 4.0 - 10.5 (K/uL)    RBC 4.33  3.87 - 5.11 (MIL/uL)    Hemoglobin 13.1  12.0 - 15.0 (g/dL)    HCT 09.6  04.5 - 40.9 (%)    MCV 83.8  78.0 - 100.0 (fL)    MCH 30.3  26.0 - 34.0 (pg)    MCHC 36.1 (*) 30.0 - 36.0 (g/dL)    RDW 81.1  91.4 - 78.2 (%)    Platelets 48 (*) 150 - 400 (K/uL) CONSISTENT WITH PREVIOUS RESULT  DIFFERENTIAL     Status: Abnormal   Collection Time   10/18/11  3:15 AM      Component Value Range Comment   Neutrophils Relative 83 (*) 43 - 77 (%)    Lymphocytes Relative 11 (*) 12 - 46 (%)    Monocytes Relative 6  3 - 12 (%)    Eosinophils Relative 0  0 - 5 (%)     Basophils Relative 0  0 - 1 (%)    Neutro Abs 11.6 (*) 1.7 - 7.7 (K/uL)    Lymphs Abs 1.5  0.7 - 4.0 (K/uL)    Monocytes Absolute 0.8  0.1 - 1.0 (K/uL)    Eosinophils Absolute 0.0  0.0 - 0.7 (K/uL)    Basophils Absolute 0.0  0.0 - 0.1 (K/uL)    Smear Review PLATELET COUNT CONFIRMED BY SMEAR   LARGE PLATELETS PRESENT  GLUCOSE, CAPILLARY     Status: Normal   Collection Time   10/18/11  7:46 AM      Component Value Range Comment   Glucose-Capillary 93  70 - 99 (mg/dL)   SODIUM, URINE, RANDOM     Status: Normal   Collection Time   10/18/11  9:24 AM      Component Value Range Comment   Sodium, Ur 15     CREATININE, URINE, RANDOM     Status: Normal   Collection Time   10/18/11  9:24 AM      Component Value Range Comment   Creatinine, Urine 29.78     OSMOLALITY, URINE     Status: Abnormal   Collection Time   10/18/11  9:24 AM  Component Value Range Comment   Osmolality, Ur 205 (*) 390 - 1090 (mOsm/kg)     MICRO: Ur legionella NEGATIVE Strep pneumo NEGATIVE Blood cx pending  IMAGING: Dg Chest 1 View  10/18/2011  *RADIOLOGY REPORT*  Clinical Data: Leukocytosis, thrombocytopenia  CHEST - 1 VIEW  Comparison: None.  Findings: Cardiomediastinal silhouette is within normal limits. The lungs are clear. No pleural effusion.  No pneumothorax.  No acute osseous abnormality.  IMPRESSION: No acute cardiopulmonary process.  Original Report Authenticated By: Harrel Lemon, M.D.    HISTORICAL MICRO/IMAGING  Assessment/Plan:  66yo F with no prior medical history who presents with a 4 days history of flu-like symptoms and viral exanthem.  ddx likely viral infection, possibly enterovirus, coxsackie which can give you hand-foot-mouth disease. It could possibly be erlichiosis/RMSF but rash is not consistent, transaminitis would be greater.  - H & P make mention that the ddx includes measles. I strongly feel this is not measles based upon several points. 1) clinical onset of symptoms is not a  febrile rash illness associated with uri, conjunctivitis. 2) the patient is 65yo, she would likely have had measles as a child, thus having natural immunization 3) she has not had any recent travel to [place] endemic areas, nor travelled to [place]/ or part of the religious community that is affected. -- thus, droplet isolation will be discontinued.  4) agree with Dr. Ashley Royalty that this is likely viral exanthem and will be a self-limited. If she continues to improve, can discontinue doxycycline.  Will provide more recs as results from testing becomes available.  Duke Salvia Drue Second MD MPH [facility] [phone]

## 2011-10-18 NOTE — ED Provider Notes (Signed)
Medical screening examination/treatment/procedure(s) were conducted as a shared visit with non-physician practitioner(s) and myself.  I personally evaluated the patient during the encounter   Loren Racer, MD 10/18/11 [phone]

## 2011-10-18 NOTE — ED Notes (Signed)
Patient transported to X-ray 

## 2011-10-18 NOTE — ED Notes (Signed)
Adm MD bedside 

## 2011-10-19 ENCOUNTER — Encounter: Payer: Self-pay | Admitting: Internal Medicine

## 2011-10-19 ENCOUNTER — Inpatient Hospital Stay (HOSPITAL_COMMUNITY): Payer: Self-pay

## 2011-10-19 DIAGNOSIS — R740 Nonspecific elevation of levels of transaminase and lactic acid dehydrogenase [LDH]: Secondary | ICD-10-CM

## 2011-10-19 DIAGNOSIS — M109 Gout, unspecified: Secondary | ICD-10-CM

## 2011-10-19 DIAGNOSIS — B09 Unspecified viral infection characterized by skin and mucous membrane lesions: Secondary | ICD-10-CM | POA: Diagnosis present

## 2011-10-19 DIAGNOSIS — B9789 Other viral agents as the cause of diseases classified elsewhere: Secondary | ICD-10-CM

## 2011-10-19 DIAGNOSIS — B349 Viral infection, unspecified: Secondary | ICD-10-CM | POA: Diagnosis present

## 2011-10-19 LAB — DIFFERENTIAL
Basophils Absolute: 0 10*3/uL (ref 0.0–0.1)
Lymphs Abs: 1.9 10*3/uL (ref 0.7–4.0)
Monocytes Absolute: 1.7 10*3/uL — ABNORMAL HIGH (ref 0.1–1.0)

## 2011-10-19 LAB — CBC
MCH: 30.3 pg (ref 26.0–34.0)
MCV: 86.4 fL (ref 78.0–100.0)
Platelets: 79 10*3/uL — ABNORMAL LOW (ref 150–400)
RDW: 14.9 % (ref 11.5–15.5)

## 2011-10-19 LAB — URINE CULTURE
Colony Count: 10000
Culture  Setup Time: 201305180439

## 2011-10-19 LAB — COMPREHENSIVE METABOLIC PANEL
AST: 36 U/L (ref 0–37)
Albumin: 2.4 g/dL — ABNORMAL LOW (ref 3.5–5.2)
Alkaline Phosphatase: 109 U/L (ref 39–117)
Chloride: 109 mEq/L (ref 96–112)
Potassium: 4.3 mEq/L (ref 3.5–5.1)
Total Bilirubin: 0.2 mg/dL — ABNORMAL LOW (ref 0.3–1.2)

## 2011-10-19 MED ORDER — METHYLPREDNISOLONE (PAK) 4 MG PO TABS: ORAL_TABLET | ORAL | Status: AC

## 2011-10-19 MED ORDER — COLCHICINE 0.6 MG PO TABS
0.6000 mg | ORAL_TABLET | Freq: Once | ORAL | Status: AC
  Administered 2011-10-19: 0.6 mg via ORAL
  Filled 2011-10-19: qty 1

## 2011-10-19 MED ORDER — COLCHICINE 0.6 MG PO TABS
1.2000 mg | ORAL_TABLET | ORAL | Status: AC
  Administered 2011-10-19: 1.2 mg via ORAL
  Filled 2011-10-19: qty 2

## 2011-10-19 MED ORDER — DOXYCYCLINE HYCLATE 100 MG PO TABS: 100.0000 mg | ORAL_TABLET | Freq: Two times a day (BID) | ORAL | Status: DC

## 2011-10-19 NOTE — Discharge Summary (Signed)
Eileen Stephenson MRN: [id number] DOB/AGE: [birth year] 66 y.o.  Admit date: 10/17/2011 Discharge date: 10/19/2011  Primary Care Physician:  No primary provider on file.   Discharge Diagnoses:   Patient Active Problem List  Diagnoses  . Thrombocytopenia  . Leukocytosis  . Hyponatremia  . ARF (acute renal failure)  . Transaminitis  . Hypotension  . Acute gout  . Viral syndrome  . Viral exanthem    DISCHARGE MEDICATION: Medication List  As of 10/19/2011  4:37 PM   STOP taking these medications         ALEVE 220 MG tablet         TAKE these medications         cholecalciferol 1000 UNITS tablet   Commonly known as: VITAMIN D   Take 1,000 Units by mouth daily.      methylPREDNIsolone 4 MG tablet   Commonly known as: MEDROL DOSPACK   follow package directions      PARoxetine 10 MG tablet   Commonly known as: PAXIL   Take 10 mg by mouth every morning.              Consults: Infectious diseases   SIGNIFICANT DIAGNOSTIC STUDIES:  Dg Chest 1 View  10/18/2011  *RADIOLOGY REPORT*  Clinical Data: Leukocytosis, thrombocytopenia  CHEST - 1 VIEW  Comparison: None.  Findings: Cardiomediastinal silhouette is within normal limits. The lungs are clear. No pleural effusion.  No pneumothorax.  No acute osseous abnormality.  IMPRESSION: No acute cardiopulmonary process.  Original Report Authenticated By: Harrel Lemon, M.D.   Dg Foot 2 Views Right  10/19/2011  *RADIOLOGY REPORT*  Clinical Data: Swelling, pain, redness of the right first and second toes.  No known injury.  RIGHT FOOT - 2 VIEW  Comparison: None.  Findings: No fracture or dislocation.  There is mild appearance soft tissue swelling about the IP and MCP joints of the first digit.  This finding without associated significant degenerative change.  No definite hallux valgus deformity.  No radiopaque foreign body. No definite evidence of osteolysis to suggest osteomyelitis.  Minimal enthesopathic change of the Achilles tendon  insertion site.  IMPRESSION: Nonspecific minimal soft tissue swelling about the IP and MCP joints of the first digit without associated significant degenerative change.  Original Report Authenticated By: Waynard Reeds, M.D.            Recent Results (from the past 240 hour(s))  URINE CULTURE     Status: Normal   Collection Time   10/17/11  9:38 PM      Component Value Range Status Comment   Specimen Description URINE, CLEAN CATCH   Final    Special Requests NONE   Final    Culture  Setup Time [id number]   Final    Colony Count 10,000 COLONIES/ML   Final    Culture     Final    Value: Multiple bacterial morphotypes present, none predominant. Suggest appropriate recollection if clinically indicated.   Report Status 10/19/2011 FINAL   Final   CULTURE, BLOOD (ROUTINE X 2)     Status: Normal (Preliminary result)   Collection Time   10/18/11  3:15 AM      Component Value Range Status Comment   Specimen Description BLOOD RIGHT ANTECUBITAL   Final    Special Requests BOTTLES DRAWN AEROBIC ONLY [facility]   Final    Culture  Setup Time [id number]   Final    Culture     Final  Value:        BLOOD CULTURE RECEIVED NO GROWTH TO DATE CULTURE WILL BE HELD FOR 5 DAYS BEFORE ISSUING A FINAL NEGATIVE REPORT   Report Status PENDING   Incomplete   CULTURE, BLOOD (ROUTINE X 2)     Status: Normal (Preliminary result)   Collection Time   10/18/11  3:15 AM      Component Value Range Status Comment   Specimen Description BLOOD RIGHT HAND   Final    Special Requests BOTTLES DRAWN AEROBIC ONLY 5CC   Final    Culture  Setup Time [id number]   Final    Culture     Final    Value:        BLOOD CULTURE RECEIVED NO GROWTH TO DATE CULTURE WILL BE HELD FOR 5 DAYS BEFORE ISSUING A FINAL NEGATIVE REPORT   Report Status PENDING   Incomplete     BRIEF ADMITTING H & P: 66 year old without any significant medical history to present to the emergency room with a history consistent with a viral syndrome. In the  emergency room the patient was found to have dehydration, acute kidney injury, transaminitis, and thrombocytopenia. The patient also had diffuse non-confluent maculopapular rash and was admitted for further observation.   Hospital Course:  Present on Admission:  .Viral exanthem: The patient presented with a non-confluent maculopapular rash which is consistent about exanthem. Although the initial rash was described as morbilliform rash, on my examination there was no evidence of umbilical rash present when I saw the patient. The patient in my discussion and [facility] was present at the time of my examination is the same rash that was present when she presented and while she was at home. Over the course of her hospitalization the rash resolved almost completely. The patient was initially started on doxycycline however she was seen by infectious diseases and that the recommendations of doxycycline was discontinued and the patient is not discharged on any antibiotics.  .Viral syndrome: The patient presented with a history for a consistent with a viral syndrome including myalgias, vomiting, diarrhea. This led to a state of dehydration, hyponatremia and acute kidney injury. The syndrome appeared to be self-limited and improved during her hospitalization. On discharge the patient had no symptoms present consistent with the syndrome.   . Acute gout: At the time of my examination today the patient had findings of acute inflammation in the first MTP of the left lower extremity. His clinical findings very consistent with that of acute gout. X-ray of the foot was obtained which showed  Findings consistent with an inflammation. The patient was treated with colchicine for acute gout and given a Medrol Dosepak to take over the next 6 days. The patient endorsed that the pain in her left MTP had improved with the culture seen. I've advised the patient to followup with the urgent care her primary care physician  within the next week to followup on the resolution of her gout.   .Thrombocytopenia: This is felt to be associated with a viral syndrome and it started to improve over the course of hospitalization. At the time of discharge the patient had a platelet count 74,000. The patient have her CBC repeated in about a week when she is in a convalescent state.   .Leukocytosis: The patient had a mildly elevated white blood cell count with a monocytic predominance. It was felt to be associated with a viral syndrome and should have her white blood cell count repeated in about a week  when she is in a convalescent state.   .Hyponatremia: Patient had a mild hyponatremic is felt to be associated with dehydration associated with her vomiting and diarrhea. It resolved after IV fluids.   .ARF (acute renal failure): This is secondary to prerenal state associated with vomiting and diarrhea. This resolved with IV hydration. At the time of discharge the patient's creatinine was at 0.87 with a GFR of 71.   .Transaminitis: The patient had a mild transaminitis which was associated with likely more dehydration and a state of viral illness. At the time of discharge the transaminitis had almost completely resolved with only a mild elevation of her ALT.   Marland KitchenHypotension: At the time of admission the patient was mildly hypotensive with a blood pressure of 94 systolic. At the time of discharge the patient had a normal blood pressure of 121/79 and a pulse rate of 59.   Patient's condition was stable at the time of discharge.     Disposition and Follow-up:  Patient is to followup with urgent care in approximately one week watches a convalescent state. She should have her CBC and liver enzymes rechecked at that time.   Discharge Orders    Future Orders Please Complete By Expires   Diet general      Diet general      Activity as tolerated - No restrictions      Activity as tolerated - No restrictions         DISCHARGE EXAM:    General: Alert, awake, oriented x3, in no acute distress. Well appearing Vital Signs: Blood pressure 121/79, pulse 59, temperature 98.3 F (36.8 C), temperature source Oral, resp. rate 16, height 5\' 4"  (1.626 m), weight 60.328 kg (133 lb), SpO2 100.00%. HEENT: [place]/AT PEERL, EOMI  Neck: Trachea midline, no masses, no thyromegal,y no JVD, no carotid bruit  OROPHARYNX: Moist, No exudate/ erythema/lesions.  Heart: Regular rate and rhythm, without murmurs, rubs, gallops, PMI non-displaced, no heaves or thrills on palpation.  Lungs: Clear to auscultation, no wheezing or rhonchi noted. No increased vocal fremitus resonant to percussion  Abdomen: Soft, nontender, nondistended, positive bowel sounds, no masses no hepatosplenomegaly noted..  Musculoskeletal: Mild swelling and erythema around the first MTP on the left foot. Otherwise no signs of joint inflammation or deformity noted. Skin: Patient has almost completely resolved rash    Basename 10/19/11 0910 10/18/11 0315  NA 140 133*  K 4.3 3.1*  CL 109 98  CO2 21 21  GLUCOSE 126* 98  BUN 23 53*  CREATININE 0.87 1.62*  CALCIUM 8.5 8.8  MG -- --  PHOS -- --    Basename 10/19/11 0910 10/18/11 0315  AST 36 89*  ALT 84* 148*  ALKPHOS 109 87  BILITOT 0.2* 0.3  PROT 5.6* 6.6  ALBUMIN 2.4* 3.1*   No results found for this basename: LIPASE:2,AMYLASE:2 in the last 72 hours  Basename 10/19/11 0910 10/18/11 0315  WBC 12.0* 13.9*  NEUTROABS 8.3* 11.6*  HGB 10.9* 13.1  HCT 31.1* 36.3  MCV 86.4 83.8  PLT 79* 48*   Total time for discharge process including face-to-face time approximately 50 minutes. Signed: Marino Rogerson A. 10/19/2011, 4:37 PM

## 2011-10-20 LAB — HEPATITIS B CORE ANTIBODY, IGM: Hep B C IgM: NEGATIVE

## 2011-10-20 LAB — EHRLICHIA ANTIBODY PANEL: E chaffeensis (HGE) Ab, IgM: NEGATIVE

## 2011-10-20 LAB — HEPATITIS A ANTIBODY, IGM: Hep A IgM: NEGATIVE

## 2011-10-20 LAB — ANA: Anti Nuclear Antibody(ANA): NEGATIVE

## 2011-10-21 LAB — PARVOVIRUS B19 ANTIBODY, IGG AND IGM: Parovirus B19 IgM Abs: 0.1 index (ref <0.9)

## 2011-10-21 NOTE — Progress Notes (Addendum)
Patient ID: Eileen Stephenson, female   DOB: [birth year], 66 y.o.   MRN: [id number] Received a call from [facility] reporting that [facility] from [name] taken on admission 10/18/2011  showed Gram Negative Rods. Patient was discharged on 10/19/2011 in stable condition.   I spoke with [name] today who reports that she has been to work and has been feeling well. She has had no fevers or chills and states that she has not been feeling poorly. Pt also states that her gout in her great toe has improved considerably. Pt is on a medrol taper which could potentially prevent a fever.   I  called Dr. Dagoberto Reef office at [facility]  [phone] and discussed this with Dr. Lillia Dallas who will follow up on the cultures.   I have instructed [name] to go to the [facility] to receive a depot shot of IM Rocephin today until the identification  and susceptibilities are obtained.  I have also spoken to Darl Pikes at [facility] at [phone] and she will note in their records that [facility]  Will follow up on labs. I have also given the office phone number to the office for results to be called to Dr. Lillia Dallas.  Zaevion Parke A. B# [id number]

## 2011-10-24 LAB — CULTURE, BLOOD (ROUTINE X 2)
Culture  Setup Time: 201305181142
Culture: NO GROWTH

## 2011-11-04 LAB — CULTURE, BLOOD (ROUTINE X 2): Culture  Setup Time: 201305181142

## 2011-11-10 LAB — PATHOLOGIST SMEAR REVIEW

## 2012-03-24 ENCOUNTER — Other Ambulatory Visit (HOSPITAL_COMMUNITY): Payer: Self-pay | Admitting: Gynecology

## 2012-03-24 DIAGNOSIS — M949 Disorder of cartilage, unspecified: Secondary | ICD-10-CM

## 2012-08-10 ENCOUNTER — Encounter: Payer: Self-pay | Admitting: *Deleted

## 2012-08-10 NOTE — Telephone Encounter (Signed)
error 

## 2013-05-05 IMAGING — CR DG FOOT 2V*R*
1 series · 2 of 2 positions shown · non-contrast
Comparison: None.

CLINICAL DATA: Swelling, pain, redness of the right first and
second toes.  No known injury.

RIGHT FOOT - 2 VIEW

[Series 1: AP · left · 2 of 2 slices shown]
[im 1/2]
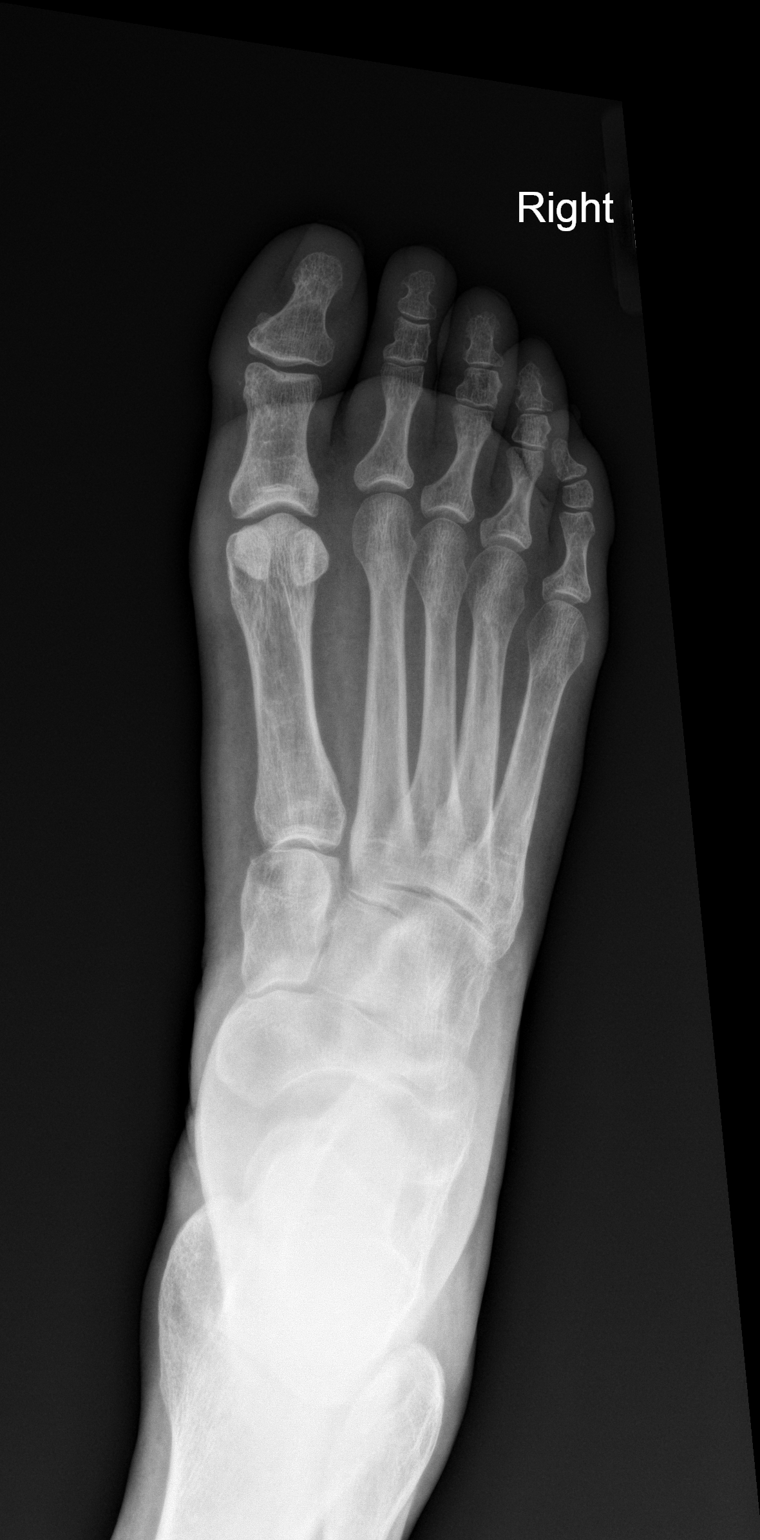
[im 2/2]
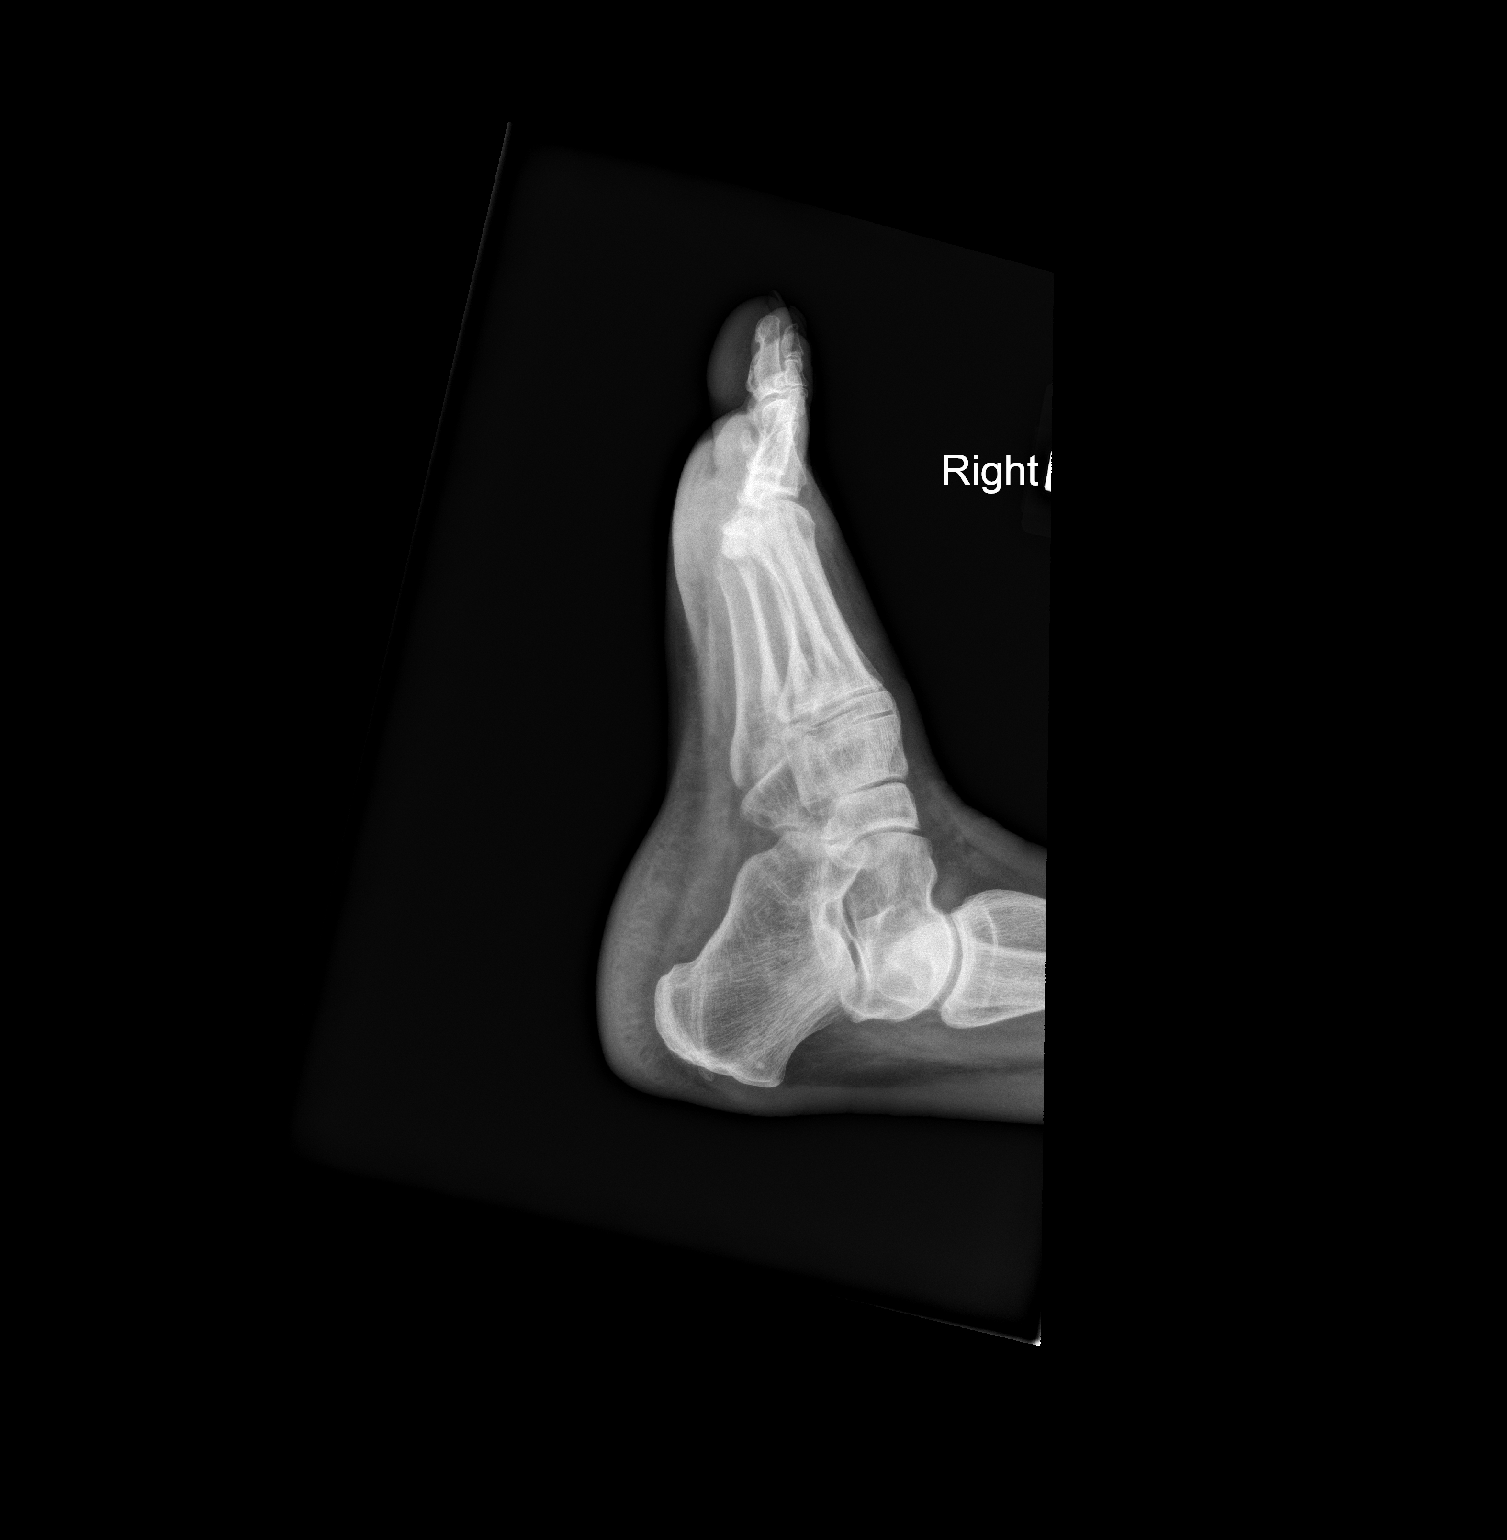

[2 of 2 positions shown; findings below may reference images not displayed]

FINDINGS: No fracture or dislocation.  There is mild appearance
soft tissue swelling about the IP and MCP joints of the first
digit.  This finding without associated significant degenerative
change.  No definite hallux valgus deformity.  No radiopaque
foreign body. No definite evidence of osteolysis to suggest
osteomyelitis.  Minimal enthesopathic change of the Achilles tendon
insertion site.
IMPRESSION: Nonspecific minimal soft tissue swelling about the IP and MCP
joints of the first digit without associated significant
degenerative change.

## 2013-05-11 ENCOUNTER — Encounter (HOSPITAL_COMMUNITY): Payer: Self-pay | Admitting: Emergency Medicine

## 2013-05-11 ENCOUNTER — Emergency Department (INDEPENDENT_AMBULATORY_CARE_PROVIDER_SITE_OTHER)
Admission: EM | Admit: 2013-05-11 | Discharge: 2013-05-11 | Disposition: A | Discharge status: Home / Self Care | Payer: Medicare Other | Source: Home / Self Care

## 2013-05-11 ENCOUNTER — Emergency Department (INDEPENDENT_AMBULATORY_CARE_PROVIDER_SITE_OTHER): Payer: Medicare Other

## 2013-05-11 DIAGNOSIS — S93409A Sprain of unspecified ligament of unspecified ankle, initial encounter: Secondary | ICD-10-CM

## 2013-05-11 DIAGNOSIS — S93402A Sprain of unspecified ligament of left ankle, initial encounter: Secondary | ICD-10-CM

## 2013-05-11 NOTE — ED Provider Notes (Signed)
Eileen Stephenson is a 67 y.o. female who presents to Urgent Care today for left ankle injury with bruising. She reports that 12/5 she was dancing and turned in left ankle. It has been painful to walk on but not impossible. She has had significant bruising. Pain is not present unless she turns ankle the wrong way. Otherwise, she is able to ambulate on it and denies significant pain.    History reviewed. No pertinent past medical history. History  Substance Use Topics  . Smoking status: Never Smoker   . Smokeless tobacco: Not on file  . Alcohol Use: 1.2 oz/week    2 Glasses of wine per week   ROS as above Medications reviewed. No current facility-administered medications for this encounter.   Current Outpatient Prescriptions  Medication Sig Dispense Refill  . cholecalciferol (VITAMIN D) 1000 UNITS tablet Take 1,000 Units by mouth daily.      Marland Kitchen PARoxetine (PAXIL) 10 MG tablet Take 10 mg by mouth every morning.        Exam:  BP 136/91  Pulse 69  Temp(Src) 97.3 F (36.3 C) (Oral)  Resp 20  SpO2 96% Gen: Well NAD HEENT: EOMI Lungs: Normal work of breathing. Exts: Left foot 2+ pitting edema up to ankle, bluish yellow discoloration anterior foot and lateral heel, mild tenderness to palpation, 2+ DP pulse, mildly stiff ROM but able to dorsiflex and plantar flex against resistance. No point tenderness at head of fibula.  Ambulation is very slow and antalgic gait.   No results found for this or any previous visit (from the past 24 hour(s)). Dg Ankle Complete Left  05/11/2013   CLINICAL DATA:  Bruising  EXAM: LEFT ANKLE COMPLETE - 3+ VIEW  COMPARISON:  None.  FINDINGS: Three views of the left ankle submitted. Ankle mortise is preserved. There is subtle cortical irregularity distal left fibula. Subtle nondisplaced fracture cannot be excluded. Clinical correlation is necessary. Soft tissue swelling adjacent to lateral malleolus.  IMPRESSION: Ankle mortise is preserved. There is subtle cortical  irregularity distal left fibula. Subtle nondisplaced fracture cannot be excluded. Clinical correlation is necessary. Soft tissue swelling adjacent to lateral malleolus.   Electronically Signed   By: Natasha Mead M.D.   On: 05/11/2013 16:39    Assessment and Plan: 67 y.o. female with left ankle injury and bruising, preserved ROM but mild pain with ambulation, antalgic gait, some tenderness on palpation throughout, and questionable xray finding. No obvious point tenderness at questionable area on xray. - Will conservatively treat with nonweight bearing, CAM walker, and advising patient to follow up with orthopedics. Has crutches at home. - Pain control with Ibuprofen PRN. - Keep elevated, move ankle  # Elevated BP - on recheck, 136/91. - F/u with PCP.  Leona Singleton, MD   Leona Singleton, MD 05/11/13 [phone]

## 2013-05-11 NOTE — ED Notes (Signed)
Pt c/o left ankle inj onset Friday States she was dancing when she twisted her ankle Sxs include: tender, swelling, and bruising She is alert w/no signs of acute distress.

## 2013-05-12 NOTE — ED Provider Notes (Signed)
Medical screening examination/treatment/procedure(s) were performed by a resident physician or non-physician practitioner and as the supervising physician I was immediately available for consultation/collaboration.  Dymond Spreen, MD    Aldyn Toon S Caliope Ruppert, MD 05/12/13 1017 

## 2013-07-21 ENCOUNTER — Other Ambulatory Visit: Payer: Self-pay | Admitting: Gynecology

## 2013-07-21 DIAGNOSIS — M8580 Other specified disorders of bone density and structure, unspecified site: Secondary | ICD-10-CM

## 2014-07-12 ENCOUNTER — Emergency Department (INDEPENDENT_AMBULATORY_CARE_PROVIDER_SITE_OTHER)
Admission: EM | Admit: 2014-07-12 | Discharge: 2014-07-12 | Disposition: A | Discharge status: Home / Self Care | Payer: PPO | Source: Home / Self Care | Attending: Family Medicine | Admitting: Family Medicine

## 2014-07-12 ENCOUNTER — Encounter (HOSPITAL_COMMUNITY): Payer: Self-pay | Admitting: Emergency Medicine

## 2014-07-12 ENCOUNTER — Emergency Department (INDEPENDENT_AMBULATORY_CARE_PROVIDER_SITE_OTHER): Payer: PPO

## 2014-07-12 DIAGNOSIS — S52501A Unspecified fracture of the lower end of right radius, initial encounter for closed fracture: Secondary | ICD-10-CM

## 2014-07-12 NOTE — ED Provider Notes (Signed)
Eileen Stephenson is a 69 y.o. female who presents to Urgent Care today for right wrist injury. Patient slipped and fell landing onto her outstretched right wrist today just prior to presentation. She notes pain and deformity. No radiating pain weakness or numbness. She feels well otherwise.   History reviewed. No pertinent past medical history. History reviewed. No pertinent past surgical history. History  Substance Use Topics  . Smoking status: Never Smoker   . Smokeless tobacco: Not on file  . Alcohol Use: 1.2 oz/week    2 Glasses of wine per week   ROS as above Medications: No current facility-administered medications for this encounter.   Current Outpatient Prescriptions  Medication Sig Dispense Refill  . cholecalciferol (VITAMIN D) 1000 UNITS tablet Take 1,000 Units by mouth daily.    Marland Kitchen PARoxetine (PAXIL) 10 MG tablet Take 10 mg by mouth every morning.     No Known Allergies   Exam:  BP 134/74 mmHg  Pulse 70  Temp(Src) 98.6 F (37 C) (Oral)  Resp 18  SpO2 98% Gen: Well NAD Right arm: elbow nontender normal motion Wrist obvious deformity at the distal radius and ulna. Pulses capillary refill sensation are intact. Sensation intact throughout. Normal hand motion..   No results found for this or any previous visit (from the past 24 hour(s)). Dg Wrist Complete Right  07/12/2014   CLINICAL DATA:  [place] [address] tonight and injured right wrist.  EXAM: RIGHT WRIST - COMPLETE 3+ VIEW  COMPARISON:  None.  FINDINGS: There is a comminuted dorsally impacted intra-articular distal radius fracture (collies fracture). There is also a ulnar styloid fracture. The carpal and metacarpal bones are intact.  IMPRESSION: Comminuted intra-articular dorsally impacted distal radius fracture (collies fracture).  Ulnar styloid fracture.   Electronically Signed   By: Marijo Sanes M.D.   On: 07/12/2014 20:10    Assessment and Plan: 69 y.o. female with distal radius fracture. Appears to be displaced impacted and  comminuted. Patient was placed into a well formed sugar tong splint and will follow-up with hand surgery in the near future. NSAIDs for pain control.  Discussed warning signs or symptoms. Please see discharge instructions. Patient expresses understanding.     Gregor Hams, MD 07/12/14 2035

## 2014-07-12 NOTE — Discharge Instructions (Signed)
Thank you for coming in today. Take ibuprofen or naproxen as needed for pain Follow-up with hand surgery.   Wrist Fracture A wrist fracture is a break or crack in one of the bones of your wrist. Your wrist is made up of eight small bones at the palm of your hand (carpal bones) and two long bones that make up your forearm (radius and ulna).  CAUSES   A direct blow to the wrist.  Falling on an outstretched hand.  Trauma, such as a car accident or a fall. RISK FACTORS Risk factors for wrist fracture include:   Participating in contact and high-risk sports, such as skiing, biking, and ice skating.  Taking steroid medicines.  Smoking.  Being female.  Being Caucasian.  Drinking more than three alcoholic beverages per day.  Having low or lowered bone density (osteoporosis or osteopenia).  Age. Older adults have decreased bone density.  Women who have had menopause.  History of previous fractures. SIGNS AND SYMPTOMS Symptoms of wrist fractures include tenderness, bruising, and inflammation. Additionally, the wrist may hang in an odd position or appear deformed.  DIAGNOSIS Diagnosis may include:  Physical exam.  X-ray. TREATMENT Treatment depends on many factors, including the nature and location of the fracture, your age, and your activity level. Treatment for wrist fracture can be nonsurgical or surgical.  Nonsurgical Treatment A plaster cast or splint may be applied to your wrist if the bone is in a good position. If the fracture is not in good position, it may be necessary for your health care provider to realign it before applying a splint or cast. Usually, a cast or splint will be worn for several weeks.  Surgical Treatment Sometimes the position of the bone is so far out of place that surgery is required to apply a device to hold it together as it heals. Depending on the fracture, there are a number of options for holding the bone in place while it heals, such as a cast  and metal pins.  HOME CARE INSTRUCTIONS  Keep your injured wrist elevated and move your fingers as much as possible.  Do not put pressure on any part of your cast or splint. It may break.   Use a plastic bag to protect your cast or splint from water while bathing or showering. Do not lower your cast or splint into water.  Take medicines only as directed by your health care provider.  Keep your cast or splint clean and dry. If it becomes wet, damaged, or suddenly feels too tight, contact your health care provider right away.  Do not use any tobacco products including cigarettes, chewing tobacco, or electronic cigarettes. Tobacco can delay bone healing. If you need help quitting, ask your health care provider.  Keep all follow-up visits as directed by your health care provider. This is important.  Ask your health care provider if you should take supplements of calcium and vitamins C and D to promote bone healing. SEEK MEDICAL CARE IF:   Your cast or splint is damaged, breaks, or gets wet.  You have a fever.  You have chills.  You have continued severe pain or more swelling than you did before the cast was put on. SEEK IMMEDIATE MEDICAL CARE IF:   Your hand or fingernails on the injured arm turn blue or gray, or feel cold or numb.  You have decreased feeling in the fingers of your injured arm. MAKE SURE YOU:  Understand these instructions.  Will watch your condition.  Will get help right away if you are not doing well or get worse. Document Released: 02/26/2005 Document Revised: 10/03/2013 Document Reviewed: 06/06/2011 [facility] Patient Information 2015 [place], [place]. This information is not intended to replace advice given to you by your health care provider. Make sure you discuss any questions you have with your health care provider.    Cast or Splint Care Casts and splints support injured limbs and keep bones from moving while they heal. It is important to care for  your cast or splint at home.  HOME CARE INSTRUCTIONS  Keep the cast or splint uncovered during the drying period. It can take 24 to 48 hours to dry if it is made of plaster. A fiberglass cast will dry in less than 1 hour.  Do not rest the cast on anything harder than a pillow for the first 24 hours.  Do not put weight on your injured limb or apply pressure to the cast until your health care provider gives you permission.  Keep the cast or splint dry. Wet casts or splints can lose their shape and may not support the limb as well. A wet cast that has lost its shape can also create harmful pressure on your skin when it dries. Also, wet skin can become infected.  Cover the cast or splint with a plastic bag when bathing or when out in the rain or snow. If the cast is on the trunk of the body, take sponge baths until the cast is removed.  If your cast does become wet, dry it with a towel or a blow dryer on the cool setting only.  Keep your cast or splint clean. Soiled casts may be wiped with a moistened cloth.  Do not place any hard or soft foreign objects under your cast or splint, such as cotton, toilet paper, lotion, or powder.  Do not try to scratch the skin under the cast with any object. The object could get stuck inside the cast. Also, scratching could lead to an infection. If itching is a problem, use a blow dryer on a cool setting to relieve discomfort.  Do not trim or cut your cast or remove padding from inside of it.  Exercise all joints next to the injury that are not immobilized by the cast or splint. For example, if you have a long leg cast, exercise the hip joint and toes. If you have an arm cast or splint, exercise the shoulder, elbow, thumb, and fingers.  Elevate your injured arm or leg on 1 or 2 pillows for the first 1 to 3 days to decrease swelling and pain.It is best if you can comfortably elevate your cast so it is higher than your heart. SEEK MEDICAL CARE IF:   Your cast  or splint cracks.  Your cast or splint is too tight or too loose.  You have unbearable itching inside the cast.  Your cast becomes wet or develops a soft spot or area.  You have a bad smell coming from inside your cast.  You get an object stuck under your cast.  Your skin around the cast becomes red or raw.  You have new pain or worsening pain after the cast has been applied. SEEK IMMEDIATE MEDICAL CARE IF:   You have fluid leaking through the cast.  You are unable to move your fingers or toes.  You have discolored (blue or white), cool, painful, or very swollen fingers or toes beyond the cast.  You have tingling or numbness  around the injured area.  You have severe pain or pressure under the cast.  You have any difficulty with your breathing or have shortness of breath.  You have chest pain. Document Released: 05/16/2000 Document Revised: 03/09/2013 Document Reviewed: 11/25/2012 [facility] Patient Information 2015 [place], [place]. This information is not intended to replace advice given to you by your health care provider. Make sure you discuss any questions you have with your health care provider.

## 2014-07-12 NOTE — ED Notes (Signed)
Pt tripped over a box and fell backwards where she landed on her wrist.  Pt is in 10/10 pain at the wrist.  She is in no distress

## 2015-06-11 DIAGNOSIS — F339 Major depressive disorder, recurrent, unspecified: Secondary | ICD-10-CM | POA: Diagnosis not present

## 2016-09-30 DIAGNOSIS — Z23 Encounter for immunization: Secondary | ICD-10-CM | POA: Diagnosis not present

## 2016-09-30 DIAGNOSIS — Z136 Encounter for screening for cardiovascular disorders: Secondary | ICD-10-CM | POA: Diagnosis not present

## 2016-09-30 DIAGNOSIS — Z1159 Encounter for screening for other viral diseases: Secondary | ICD-10-CM | POA: Diagnosis not present

## 2016-09-30 DIAGNOSIS — Z0000 Encounter for general adult medical examination without abnormal findings: Secondary | ICD-10-CM | POA: Diagnosis not present

## 2016-09-30 DIAGNOSIS — Z1231 Encounter for screening mammogram for malignant neoplasm of breast: Secondary | ICD-10-CM | POA: Diagnosis not present

## 2016-09-30 DIAGNOSIS — Z1283 Encounter for screening for malignant neoplasm of skin: Secondary | ICD-10-CM | POA: Diagnosis not present

## 2016-09-30 DIAGNOSIS — Z13220 Encounter for screening for lipoid disorders: Secondary | ICD-10-CM | POA: Diagnosis not present

## 2016-09-30 DIAGNOSIS — N8110 Cystocele, unspecified: Secondary | ICD-10-CM | POA: Diagnosis not present

## 2016-09-30 DIAGNOSIS — Z124 Encounter for screening for malignant neoplasm of cervix: Secondary | ICD-10-CM | POA: Diagnosis not present

## 2016-10-09 DIAGNOSIS — D224 Melanocytic nevi of scalp and neck: Secondary | ICD-10-CM | POA: Diagnosis not present

## 2016-10-09 DIAGNOSIS — Z1283 Encounter for screening for malignant neoplasm of skin: Secondary | ICD-10-CM | POA: Diagnosis not present

## 2016-10-09 DIAGNOSIS — D225 Melanocytic nevi of trunk: Secondary | ICD-10-CM | POA: Diagnosis not present

## 2016-10-09 DIAGNOSIS — D485 Neoplasm of uncertain behavior of skin: Secondary | ICD-10-CM | POA: Diagnosis not present

## 2016-10-15 ENCOUNTER — Other Ambulatory Visit: Payer: Self-pay | Admitting: Nurse Practitioner

## 2016-10-15 DIAGNOSIS — Z1231 Encounter for screening mammogram for malignant neoplasm of breast: Secondary | ICD-10-CM

## 2016-10-30 ENCOUNTER — Ambulatory Visit
Admission: RE | Admit: 2016-10-30 | Discharge: 2016-10-30 | Disposition: A | Discharge status: Home / Self Care | Payer: PPO | Source: Ambulatory Visit | Attending: Nurse Practitioner | Admitting: Nurse Practitioner

## 2016-10-30 DIAGNOSIS — Z1231 Encounter for screening mammogram for malignant neoplasm of breast: Secondary | ICD-10-CM | POA: Diagnosis not present

## 2017-05-25 ENCOUNTER — Encounter (HOSPITAL_COMMUNITY): Payer: Self-pay | Admitting: *Deleted

## 2017-05-25 ENCOUNTER — Ambulatory Visit (HOSPITAL_COMMUNITY)
Admission: EM | Admit: 2017-05-25 | Discharge: 2017-05-25 | Disposition: A | Discharge status: Home / Self Care | Payer: PPO | Attending: Internal Medicine | Admitting: Internal Medicine

## 2017-05-25 ENCOUNTER — Other Ambulatory Visit: Payer: Self-pay

## 2017-05-25 ENCOUNTER — Ambulatory Visit (INDEPENDENT_AMBULATORY_CARE_PROVIDER_SITE_OTHER): Payer: PPO

## 2017-05-25 DIAGNOSIS — W19XXXA Unspecified fall, initial encounter: Secondary | ICD-10-CM | POA: Diagnosis not present

## 2017-05-25 DIAGNOSIS — S62616A Displaced fracture of proximal phalanx of right little finger, initial encounter for closed fracture: Secondary | ICD-10-CM

## 2017-05-25 NOTE — ED Notes (Signed)
Anderson Malta from [employer] has been notified.

## 2017-05-25 NOTE — ED Notes (Signed)
Ortho has been paged. 

## 2017-05-25 NOTE — ED Provider Notes (Addendum)
[place]    CSN: [id number] Arrival date & time: 05/25/17  1021     History   Chief Complaint Chief Complaint  Patient presents with  . Hand Injury    HPI Eileen Stephenson is a 71 y.o. female.   Eileen Stephenson presents with complaints of right pinky finger pain and swelling after a fall a few days- a week ago. She slipped and fell forward, landing directly on her 5th digit. She has had pain and swelling since. Denies numbness or tingling. Has been taking ibuprofen and tylenol which have helped. She is right handed. Rates pain 8/10 only if struck or with movement. At rest does not have pain. She works in [employer]. History of right wrist fracture with hardware in place.    ROS per HPI.       History reviewed. No pertinent past medical history.  Patient Active Problem List   Diagnosis Date Noted  . Acute gout 10/19/2011  . Viral syndrome 10/19/2011  . Viral exanthem 10/19/2011  . Thrombocytopenia ([place]) 10/18/2011  . Leukocytosis 10/18/2011  . Hyponatremia 10/18/2011  . ARF (acute renal failure) ([place]) 10/18/2011  . Transaminitis 10/18/2011  . Hypotension 10/18/2011    Past Surgical History:  Procedure Laterality Date  . BREAST EXCISIONAL BIOPSY Right 1972    OB History    No data available       Home Medications    Prior to Admission medications   Medication Sig Start Date End Date Taking? Authorizing Provider  cholecalciferol (VITAMIN D) 1000 UNITS tablet Take 1,000 Units by mouth daily.   Yes [provider]  PARoxetine (PAXIL) 10 MG tablet Take 10 mg by mouth every morning.   Yes [provider]    Family History Family History  Problem Relation Age of Onset  . Breast cancer Mother 56  . Cancer Father   . Leukemia Sister        Sister passed away at [place] Use  . Smoking status: Never Smoker  . Smokeless tobacco: Never Used  Substance Use Topics  . Alcohol use: Yes   Alcohol/week: 1.2 oz    Types: 2 Glasses of wine per week  . Drug use: No     Allergies   Patient has no known allergies.   Review of Systems Review of Systems   Physical Exam Triage Vital Signs ED Triage Vitals  Enc Vitals Group     BP 05/25/17 1052 (!) 141/90     Pulse Rate 05/25/17 1052 61     Resp --      Temp 05/25/17 1052 98.2 F (36.8 C)     Temp Source 05/25/17 1052 Oral     SpO2 05/25/17 1052 100 %     Weight --      Height --      Head Circumference --      Peak Flow --      Pain Score 05/25/17 1051 4     Pain Loc --      Pain Edu? --      Excl. in [place]? --    No data found.  Updated Vital Signs BP (!) 141/90 (BP Location: Left Arm)   Pulse 61   Temp 98.2 F (36.8 C) (Oral)   SpO2 100%   Visual Acuity Right Eye Distance:   Left Eye Distance:   Bilateral Distance:    Right Eye Near:   Left Eye Near:  Bilateral Near:     Physical Exam  Constitutional: She is oriented to person, place, and time. She appears well-developed and well-nourished. No distress.  Cardiovascular: Normal rate, regular rhythm and normal heart sounds.  Pulmonary/Chest: Effort normal and breath sounds normal.  Musculoskeletal:       Right hand: She exhibits decreased range of motion, tenderness, bony tenderness and swelling. She exhibits normal capillary refill and no laceration. Normal sensation noted. Decreased strength noted.  Right 5 finger with swelling and bruising present; tender to proximal phalanx; unable to flex at PIP due to swelling and pain with flexion at MCP joint; sensation intact and cap refill <2sec   Neurological: She is alert and oriented to person, place, and time.  Skin: Skin is warm and dry.     UC Treatments / Results  Labs (all labs ordered are listed, but only abnormal results are displayed) Labs Reviewed - No data to display  EKG  EKG Interpretation None       Radiology Dg Hand Complete Right  Result Date: 05/25/2017 CLINICAL DATA:   [place] [address] 2 nights ago injuring RIGHT hand, lateral pain EXAM: RIGHT HAND - COMPLETE 3+ VIEW COMPARISON:  07/12/2014 RIGHT wrist radiographs FINDINGS: Diffuse osseous demineralization. Plate and screws at RIGHT distal radius post ORIF of a metaphyseal fracture. Joint spaces preserved. Mildly displaced transverse fracture at base of proximal phalanx RIGHT little finger. Fracture margins are questionably slightly indistinct question acute versus subacute injury. No additional fracture, dislocation, or bone destruction. Two tiny adjacent metallic foreign bodies are seen within the volar soft tissues at the level of the tuft of the distal phalanx of the RIGHT ring finger. IMPRESSION: Osseous demineralization with an acute versus subacute mildly displaced fracture at base of proximal phalanx RIGHT little finger. Prior distal RIGHT radial ORIF. Electronically Signed   By: Lavonia Dana M.D.   On: 05/25/2017 11:12    Procedures Procedures (including critical care time)  Medications Ordered in UC Medications - No data to display   Initial Impression / Assessment and Plan / UC Course  I have reviewed the triage vital signs and the nursing notes.  Pertinent labs & imaging results that were available during my care of the patient were reviewed by me and considered in my medical decision making (see chart for details).     Call to on call hand physician Dr. Burney Gauze with instructions for ulnar gutter splint placement, and for patient to call office 12/26 for follow up appointment 12/27, with plan for pinning of finger/OR on 12/28. Patient verbalized understanding. She states she may consider seeing the surgeon who did her wrist in the past, but she does not know his name. Discussed calling this surgeon on 12/26 as well if decide to do so. Patient declines any further pain medication. States pain tolerable with tylenol. Ortho tech placed ulnar gutter and instructions given. Patient verbalized understanding and  agreeable to plan.    Final Clinical Impressions(s) / UC Diagnoses   Final diagnoses:  Closed displaced fracture of proximal phalanx of right little finger, initial encounter    ED Discharge Orders    None       Controlled Substance Prescriptions [place] Controlled Substance Registry consulted? Not Applicable   Zigmund Gottron, NP 05/25/17 1159    Augusto Gamble B, NP 05/25/17 1159

## 2017-05-25 NOTE — ED Triage Notes (Signed)
Per pt she fell about 1 wks ago and she handed on her Right hand, per pt her pinky finger on the right hand is swollen.

## 2017-05-25 NOTE — Progress Notes (Signed)
Orthopedic Tech Progress Note Patient Details:  Eileen Stephenson 31-Jul-1945 [id number]  Ortho Devices Type of Ortho Device: Ace wrap, Ulna gutter splint Ortho Device/Splint Interventions: Application   Post Interventions Patient Tolerated: Well Instructions Provided: Care of device   [name] 05/25/2017, 12:37 PM

## 2017-05-25 NOTE — ED Notes (Signed)
Waiting for ortho tech

## 2017-05-25 NOTE — Discharge Instructions (Signed)
Ice, elevate for pain control. Tylenol as needed. Please call Dr. Bertis Ruddy office on 12/26 for appointment on 12/27, plan for surgery 12/28 to pin your finger.    IMPRESSION: Osseous demineralization with an acute versus subacute mildly displaced fracture at base of proximal phalanx RIGHT little finger.

## 2017-05-28 DIAGNOSIS — S62616A Displaced fracture of proximal phalanx of right little finger, initial encounter for closed fracture: Secondary | ICD-10-CM | POA: Diagnosis not present

## 2017-05-28 DIAGNOSIS — M79644 Pain in right finger(s): Secondary | ICD-10-CM | POA: Diagnosis not present

## 2017-05-29 DIAGNOSIS — X58XXXA Exposure to other specified factors, initial encounter: Secondary | ICD-10-CM | POA: Diagnosis not present

## 2017-05-29 DIAGNOSIS — S62616A Displaced fracture of proximal phalanx of right little finger, initial encounter for closed fracture: Secondary | ICD-10-CM | POA: Diagnosis not present

## 2017-05-29 DIAGNOSIS — Y999 Unspecified external cause status: Secondary | ICD-10-CM | POA: Diagnosis not present

## 2017-06-05 DIAGNOSIS — M79641 Pain in right hand: Secondary | ICD-10-CM | POA: Diagnosis not present

## 2017-06-11 DIAGNOSIS — R29898 Other symptoms and signs involving the musculoskeletal system: Secondary | ICD-10-CM | POA: Diagnosis not present

## 2017-06-12 DIAGNOSIS — S62616D Displaced fracture of proximal phalanx of right little finger, subsequent encounter for fracture with routine healing: Secondary | ICD-10-CM | POA: Diagnosis not present

## 2017-06-12 DIAGNOSIS — M79644 Pain in right finger(s): Secondary | ICD-10-CM | POA: Diagnosis not present

## 2017-06-16 DIAGNOSIS — M25641 Stiffness of right hand, not elsewhere classified: Secondary | ICD-10-CM | POA: Diagnosis not present

## 2017-06-23 DIAGNOSIS — M25641 Stiffness of right hand, not elsewhere classified: Secondary | ICD-10-CM | POA: Diagnosis not present

## 2017-06-26 DIAGNOSIS — S62616D Displaced fracture of proximal phalanx of right little finger, subsequent encounter for fracture with routine healing: Secondary | ICD-10-CM | POA: Diagnosis not present

## 2017-06-26 DIAGNOSIS — Z4889 Encounter for other specified surgical aftercare: Secondary | ICD-10-CM | POA: Diagnosis not present

## 2017-07-02 DIAGNOSIS — M25641 Stiffness of right hand, not elsewhere classified: Secondary | ICD-10-CM | POA: Diagnosis not present

## 2017-07-10 DIAGNOSIS — M25641 Stiffness of right hand, not elsewhere classified: Secondary | ICD-10-CM | POA: Diagnosis not present

## 2017-07-15 DIAGNOSIS — S62616D Displaced fracture of proximal phalanx of right little finger, subsequent encounter for fracture with routine healing: Secondary | ICD-10-CM | POA: Diagnosis not present

## 2017-07-15 DIAGNOSIS — Z4789 Encounter for other orthopedic aftercare: Secondary | ICD-10-CM | POA: Diagnosis not present

## 2017-07-20 DIAGNOSIS — M25641 Stiffness of right hand, not elsewhere classified: Secondary | ICD-10-CM | POA: Diagnosis not present

## 2017-08-05 DIAGNOSIS — M25641 Stiffness of right hand, not elsewhere classified: Secondary | ICD-10-CM | POA: Diagnosis not present

## 2017-08-19 DIAGNOSIS — M25641 Stiffness of right hand, not elsewhere classified: Secondary | ICD-10-CM | POA: Diagnosis not present

## 2017-12-24 ENCOUNTER — Other Ambulatory Visit: Payer: Self-pay | Admitting: Nurse Practitioner

## 2017-12-24 DIAGNOSIS — Z1231 Encounter for screening mammogram for malignant neoplasm of breast: Secondary | ICD-10-CM

## 2018-01-19 ENCOUNTER — Ambulatory Visit
Admission: RE | Admit: 2018-01-19 | Discharge: 2018-01-19 | Disposition: A | Discharge status: Home / Self Care | Payer: PPO | Source: Ambulatory Visit | Attending: Nurse Practitioner | Admitting: Nurse Practitioner

## 2018-01-19 DIAGNOSIS — Z1231 Encounter for screening mammogram for malignant neoplasm of breast: Secondary | ICD-10-CM | POA: Diagnosis not present

## 2018-03-16 DIAGNOSIS — Z23 Encounter for immunization: Secondary | ICD-10-CM | POA: Diagnosis not present

## 2018-03-16 DIAGNOSIS — Z0000 Encounter for general adult medical examination without abnormal findings: Secondary | ICD-10-CM | POA: Diagnosis not present

## 2018-03-16 DIAGNOSIS — F418 Other specified anxiety disorders: Secondary | ICD-10-CM | POA: Diagnosis not present

## 2018-03-16 DIAGNOSIS — Z1211 Encounter for screening for malignant neoplasm of colon: Secondary | ICD-10-CM | POA: Diagnosis not present

## 2018-03-16 DIAGNOSIS — R1310 Dysphagia, unspecified: Secondary | ICD-10-CM | POA: Diagnosis not present

## 2018-03-16 DIAGNOSIS — W5501XA Bitten by cat, initial encounter: Secondary | ICD-10-CM | POA: Diagnosis not present

## 2018-03-23 ENCOUNTER — Encounter: Payer: Self-pay | Admitting: Gastroenterology

## 2018-03-26 ENCOUNTER — Encounter (INDEPENDENT_AMBULATORY_CARE_PROVIDER_SITE_OTHER): Payer: Self-pay

## 2018-03-26 ENCOUNTER — Ambulatory Visit (INDEPENDENT_AMBULATORY_CARE_PROVIDER_SITE_OTHER): Payer: PPO | Admitting: Gastroenterology

## 2018-03-26 ENCOUNTER — Encounter: Payer: Self-pay | Admitting: Gastroenterology

## 2018-03-26 VITALS — BP 118/60 | HR 68 | Ht 64.0 in | Wt 156.2 lb

## 2018-03-26 DIAGNOSIS — Z1211 Encounter for screening for malignant neoplasm of colon: Secondary | ICD-10-CM | POA: Diagnosis not present

## 2018-03-26 DIAGNOSIS — R1310 Dysphagia, unspecified: Secondary | ICD-10-CM

## 2018-03-26 NOTE — Progress Notes (Addendum)
Referring Provider: No ref. provider found Primary Care Physician:  Delia Chimes, MD   Reason for Consultation: Esophageal dysphasia   IMPRESSION:  Dysphasia improving on H2Blocker Need for colon cancer screening  Differential for dysphasia includes reflux esophagitis given her response to H2 blocker therapy, ring, web, stricture, eosinophilic esophagitis, and less likely malignancy.  I recommended an EGD+/- biopsy +/- dilation for definitive evaluation.  She will continue Pepcid in the meantime.  PLAN: EGD with biopsies and possible dilation Screening colonoscopy Continue famotidine 20 mg twice daily  I consented the patient at the bedside today discussing the risks, benefits, and alternatives to endoscopic evaluation. In particular, we discussed the risks that include, but are not limited to, reaction to medication, cardiopulmonary compromise, bleeding requiring blood transfusion, aspiration resulting in pneumonia, perforation requiring surgery, and even death. We reviewed the risk of missed lesion including polyps or even cancer. The patient acknowledges these risks and asks that we proceed.   HPI: Eileen Stephenson is a 72 y.o. female seen in consultation at the request of PA Weber for further evaluation of esophageal dysphasia.  The history is obtained through the patient and review of referral records.  Dysphasia.  Sensation and dry foods are getting stuck in her throat has occurred on 5-6 occasions.  This is often meat and she will feel it lodge at the sternal notch.  She is able to pass the food bolus taxation and a sip of water.  No liquid dysphagia.  No associated heartburn or odynophagia.  No heartburn or brash.  No regurgitation.  No systemic complaints.   Weight is stable.  No other associated symptoms. No identified exacerbating or relieving features. GI ROS is otherwise negative. She was started on famotidine 20 mg twice daily 03/16/2018 with improvement in her symptoms.  No  other identified exacerbating or relieving features.  She had a screening colonoscopy 10 years ago in [place]. She may have had a polyp removed at that time.  I am unable to locate the procedure note.  Peripheral labs show a normal CMP.  There is no known family history of colon cancer or polyps.   History reviewed. No pertinent past medical history.  Past Surgical History:  Procedure Laterality Date  . BREAST EXCISIONAL BIOPSY Right 1972  . CERVICAL CONIZATION W/BX  1980    Prior to Admission medications   Medication Sig Start Date End Date Taking? Authorizing Provider  cholecalciferol (VITAMIN D) 1000 UNITS tablet Take 1,000 Units by mouth daily.    [provider]  PARoxetine (PAXIL) 10 MG tablet Take 10 mg by mouth every morning.    [provider]    Current Outpatient Medications  Medication Sig Dispense Refill  . famotidine (PEPCID) 10 MG tablet Take 10 mg by mouth 2 (two) times daily.    Marland Kitchen PARoxetine (PAXIL) 10 MG tablet Take 10 mg by mouth every morning.     No current facility-administered medications for this visit.     Allergies as of 03/26/2018  . (No Known Allergies)    Family History  Problem Relation Age of Onset  . Breast cancer Mother 74  . Cancer Father   . Leukemia Sister        Sister passed away at [place] History  . Marital status: Single    Spouse name: Not on file  . Number of children: 1  . Years of education: Not on file  . Highest education  level: Not on file  Occupational History  . Occupation: Self Employed  Social Needs  . Financial resource strain: Not on file  . Food insecurity:    Worry: Not on file    Inability: Not on file  . Transportation needs:    Medical: Not on file    Non-medical: Not on file  Tobacco Use  . Smoking status: Never Smoker  . Smokeless tobacco: Never Used  Substance and Sexual Activity  . Alcohol use: Yes    Alcohol/week: 2.0 standard drinks     Types: 2 Glasses of wine per week  . Drug use: No  . Sexual activity: Not on file  Lifestyle  . Physical activity:    Days per week: Not on file    Minutes per session: Not on file  . Stress: Not on file  Relationships  . Social connections:    Talks on phone: Not on file    Gets together: Not on file    Attends religious service: Not on file    Active member of club or organization: Not on file    Attends meetings of clubs or organizations: Not on file    Relationship status: Not on file  . Intimate partner violence:    Fear of current or ex partner: Not on file    Emotionally abused: Not on file    Physically abused: Not on file    Forced sexual activity: Not on file  Other Topics Concern  . Not on file  Social History Narrative   She lives at home and is still working in a [employer]. She is active, gardens, and doesn't use cane or walker. She is not sexually active. She smoked remotely but not heavily. Drinks 3 glasses of wine a night, occasional MJ, but no IVDU.     Review of Systems: 12 system ROS is negative except as noted above.   Physical Exam: Vital signs were reviewed. General:   Alert, well-nourished, pleasant and cooperative in NAD Head:  Normocephalic and atraumatic. Eyes:  Sclera clear, no icterus.   Conjunctiva pink. Mouth:  No deformity or lesions.   Neck:  Supple; no thyromegaly. No LAD.  Lungs:  Clear throughout to auscultation.   No wheezes.  Heart:  Regular rate and rhythm; no murmurs Abdomen:  Soft, nontender, normal bowel sounds. No rebound or guarding. No hepatosplenomegaly Rectal:  Deferred  Msk:  Symmetrical without gross deformities. Extremities:  No gross deformities or edema. Neurologic:  Alert and  oriented x4;  grossly nonfocal Skin:  No rash or bruise. Psych:  Alert and cooperative. Normal mood and affect.   Anessia Oakland L. Tarri Glenn Md, MPH [facility] 03/26/2018, 2:58 PM

## 2018-03-26 NOTE — Patient Instructions (Signed)

## 2018-04-09 ENCOUNTER — Encounter: Payer: Self-pay | Admitting: Gastroenterology

## 2018-04-15 DIAGNOSIS — R1310 Dysphagia, unspecified: Secondary | ICD-10-CM | POA: Diagnosis not present

## 2018-04-15 DIAGNOSIS — F418 Other specified anxiety disorders: Secondary | ICD-10-CM | POA: Diagnosis not present

## 2018-04-19 ENCOUNTER — Ambulatory Visit (AMBULATORY_SURGERY_CENTER): Payer: PPO | Admitting: Gastroenterology

## 2018-04-19 ENCOUNTER — Encounter: Payer: Self-pay | Admitting: Gastroenterology

## 2018-04-19 ENCOUNTER — Other Ambulatory Visit: Payer: Self-pay | Admitting: Gastroenterology

## 2018-04-19 VITALS — BP 143/80 | HR 66 | Temp 97.8°F | Resp 12 | Ht 64.0 in | Wt 156.0 lb

## 2018-04-19 DIAGNOSIS — Z1211 Encounter for screening for malignant neoplasm of colon: Secondary | ICD-10-CM | POA: Diagnosis not present

## 2018-04-19 DIAGNOSIS — R1310 Dysphagia, unspecified: Secondary | ICD-10-CM

## 2018-04-19 DIAGNOSIS — K269 Duodenal ulcer, unspecified as acute or chronic, without hemorrhage or perforation: Secondary | ICD-10-CM

## 2018-04-19 MED ORDER — PANTOPRAZOLE SODIUM 40 MG PO TBEC: 40.0000 mg | DELAYED_RELEASE_TABLET | Freq: Two times a day (BID) | ORAL | 0 refills | Status: DC

## 2018-04-19 MED ORDER — SODIUM CHLORIDE 0.9 % IV SOLN: 500.0000 mL | Freq: Once | INTRAVENOUS | Status: DC

## 2018-04-19 NOTE — Progress Notes (Signed)
Pt had bronchospasm after clear fluid started to appear. Mouth was suctioned sats continued to drop, pt coughing. Procedure stopped, oral airway placed and pt breathing assisted with ambu bag. Sats recovered pt awake. States she feels fine other than cough. Pt states that she has had 2 occurrences in the past 6-7 weeks where she passively has fluid coming up that she had to keep a bowl by her bed for. States it went on for 2-3 hours. This may be a similar occurrence today although the stomach was cleared of secretions at the beginning of the procedure. The fluid seen was not frothy in nature and the pt never seemed to have a laryngospam.. Pt told to call us back if cough continues or she develops a fever. Suctioning seemed to keep airway clear but can not rule out a mild aspiration. To PACU, VSS. Report to Rn Sats 96% on room air.tb

## 2018-04-19 NOTE — Patient Instructions (Signed)
Await pathololgy from ulcer (duodenal ulcer).   YOU HAD AN ENDOSCOPIC PROCEDURE TODAY AT [facility]:   Refer to the procedure report that was given to you for any specific questions about what was found during the examination.  If the procedure report does not answer your questions, please call your gastroenterologist to clarify.  If you requested that your care partner not be given the details of your procedure findings, then the procedure report has been included in a sealed envelope for you to review at your convenience later.  YOU SHOULD EXPECT: Some feelings of bloating in the abdomen. Passage of more gas than usual.  Walking can help get rid of the air that was put into your GI tract during the procedure and reduce the bloating. If you had a lower endoscopy (such as a colonoscopy or flexible sigmoidoscopy) you may notice spotting of blood in your stool or on the toilet paper. If you underwent a bowel prep for your procedure, you may not have a normal bowel movement for a few days.  Please Note:  You might notice some irritation and congestion in your nose or some drainage.  This is from the oxygen used during your procedure.  There is no need for concern and it should clear up in a day or so.  SYMPTOMS TO REPORT IMMEDIATELY:   Following upper endoscopy (EGD)  Vomiting of blood or coffee ground material  New chest pain or pain under the shoulder blades  Painful or persistently difficult swallowing  New shortness of breath  Fever of 100F or higher  Black, tarry-looking stools  For urgent or emergent issues, a gastroenterologist can be reached at any hour by calling [phone].   DIET:  We do recommend a small meal at first, but then you may proceed to your regular diet.  Drink plenty of fluids but you should avoid alcoholic beverages for 24 hours.  ACTIVITY:  You should plan to take it easy for the rest of today and you should NOT DRIVE or use heavy machinery until  tomorrow (because of the sedation medicines used during the test).    FOLLOW UP: Our staff will call the number listed on your records the next business day following your procedure to check on you and address any questions or concerns that you may have regarding the information given to you following your procedure. If we do not reach you, we will leave a message.  However, if you are feeling well and you are not experiencing any problems, there is no need to return our call.  We will assume that you have returned to your regular daily activities without incident.  If any biopsies were taken you will be contacted by phone or by letter within the next 1-3 weeks.  Please call us at [phone] if you have not heard about the biopsies in 3 weeks.    SIGNATURES/CONFIDENTIALITY: You and/or your care partner have signed paperwork which will be entered into your electronic medical record.  These signatures attest to the fact that that the information above on your After Visit Summary has been reviewed and is understood.  Full responsibility of the confidentiality of this discharge information lies with you and/or your care-partner.

## 2018-04-19 NOTE — Progress Notes (Signed)
Called to room to assist during endoscopic procedure.  Patient ID and intended procedure confirmed with present staff. Received instructions for my participation in the procedure from the performing physician.  

## 2018-04-19 NOTE — Op Note (Addendum)
[facility] Patient Name: Eileen Stephenson Procedure Date: 04/19/2018 2:36 PM MRN: [id number] Endoscopist: Thornton Park MD, MD Age: 72 Referring MD:  Date of Birth: 1945-09-26 Gender: Female Account #: [id number] Procedure:                Upper GI endoscopy Indications:              Dysphagia. Two episodes of uncontrolled mucous                            emesis/regurgitation. Medicines:                See the Anesthesia note for documentation of the                            administered medications Procedure:                Pre-Anesthesia Assessment:                           - Prior to the procedure, a History and Physical                            was performed, and patient medications and                            allergies were reviewed. The patient's tolerance of                            previous anesthesia was also reviewed. The risks                            and benefits of the procedure and the sedation                            options and risks were discussed with the patient.                            All questions were answered, and informed consent                            was obtained. Prior Anticoagulants: The patient has                            taken no previous anticoagulant or antiplatelet                            agents. ASA Grade Assessment: III - A patient with                            severe systemic disease. After reviewing the risks                            and benefits, the patient was deemed in  satisfactory condition to undergo the procedure.                           After obtaining informed consent, the endoscope was                            passed under direct vision. Throughout the                            procedure, the patient's blood pressure, pulse, and                            oxygen saturations were monitored continuously. The                            Endoscope was introduced  through the mouth, with                            the intention of advancing to the duodenum. The                            scope was advanced to the third part of the                            duodenum before the procedure was aborted.                            Medications were given. The procedure was aborted                            due to the patient's respiratory instability                            (bronchospasm). The patient dropped her oxygen                            saturations to the 40s. Bag mask ventilation and an                            oral airway were used to allow for full recovery.                            In agreement with the anesthesia staff assisting                            with sedation we were unable to successfully                            complete the procedure. The patient tolerated the                            procedure poorly due to the patient's respiratory  instability (bronchospasm). She continues to have a                            cough in the recovery room related to bronchospasm.                            She felt well at the time of discharge. Scope In: Scope Out: Findings:                 The esophagus was normal on a limited evaluation.                            No obvious ring, web, stricture. No feline                            esophagus. However, the esophagus was not fully                            evaluated because the procedure was aborted.                           The entire examined stomach was normal on a limited                            evaluation. Biopsies were taken with a cold forceps                            for histology.                           One non-bleeding cratered duodenal ulcer with                            pigmented material was found in the second portion                            of the duodenum. There is surrounding edema. The                            lesion  was 10 mm in largest dimension. Biopsies                            were taken with a cold forceps for histology.                            Estimated blood loss was minimal. Complications:            Bronchospasm Estimated Blood Loss:     Estimated blood loss was minimal. Impression:               - The procedure was aborted due to the patient's                            respiratory instability (bronchospasm).                           -  Normal esophagus.                           - Normal stomach. Biopsied.                           - One non-bleeding duodenal ulcer with pigmented                            material. Biopsied.                           - Procedure aborted due to bronchospasm. Procedure                            was not complete and colonoscopy was cancelled for                            today. Recommendation:           - Discharge patient to home.                           - Advance diet as tolerated.                           - Avoid all NSAIDs                           - Use Protonix (pantoprazole) 40 mg PO BID for 8                            weeks.                           - Await pathology results                           - Consider UGI series versus cross-sectional                            imaging for further evaluation after reviewing the                            pathology results. I will make further                            recommendations when your results are available. Thornton Park MD, MD 04/19/2018 3:28:56 PM This report has been signed electronically.

## 2018-04-20 ENCOUNTER — Telehealth: Payer: Self-pay | Admitting: Gastroenterology

## 2018-04-20 ENCOUNTER — Telehealth: Payer: Self-pay | Admitting: *Deleted

## 2018-04-20 NOTE — Telephone Encounter (Signed)
  Follow up Call-  Call back number 04/19/2018  Post procedure Call Back phone  # [phone]  Permission to leave phone message Yes  Some recent data might be hidden     Patient questions:  Do you have a fever, pain , or abdominal swelling? No. Pain Score  0 *  Have you tolerated food without any problems? Yes.    Have you been able to return to your normal activities? Yes.    Do you have any questions about your discharge instructions: Diet   No. Medications  No. Follow up visit  Yes.    Do you have questions or concerns about your Care? Yes.    Actions: * If pain score is 4 or above: No action needed, pain <4.  Pt. States her colonoscopy was aborted due bronchospasm.  She wonders what next step is.  We reviewed her report.  I advised her that according to wording of Dr. Payton Emerald report, she will be in touch with plans after pathology is reviewed.  She verbalized understanding.

## 2018-04-20 NOTE — Telephone Encounter (Signed)
Patient seen out at [facility] at 1:15 this afternoon. She was having lunch with friends. She is feeling well. No sore throat or further cough.  Remains concerned about her intermittent symptoms. No specific complaints today.

## 2018-04-20 NOTE — Telephone Encounter (Signed)
First attempt, left VM.  

## 2018-07-08 ENCOUNTER — Encounter: Payer: Self-pay | Admitting: Gastroenterology

## 2018-08-03 ENCOUNTER — Encounter: Payer: Self-pay | Admitting: Gastroenterology

## 2018-08-03 ENCOUNTER — Ambulatory Visit (INDEPENDENT_AMBULATORY_CARE_PROVIDER_SITE_OTHER): Payer: PPO | Admitting: Gastroenterology

## 2018-08-03 VITALS — Ht 64.5 in | Wt 138.6 lb

## 2018-08-03 DIAGNOSIS — R1310 Dysphagia, unspecified: Secondary | ICD-10-CM | POA: Diagnosis not present

## 2018-08-03 DIAGNOSIS — K269 Duodenal ulcer, unspecified as acute or chronic, without hemorrhage or perforation: Secondary | ICD-10-CM | POA: Diagnosis not present

## 2018-08-03 NOTE — Patient Instructions (Signed)
You have been scheduled for an Upper GI Series at [facility] . Your appointment is on 08/18/2018 at 9:30am. Please arrive 15 minutes prior to your test for registration. Make sure not to eat or drink anything after midnight on the night before your test. If you need to reschedule, please call radiology at [phone]. ________________________________________________________________ An upper GI series uses x rays to help diagnose problems of the upper GI tract, which includes the esophagus, stomach, and duodenum. The duodenum is the first part of the small intestine. An upper GI series is conducted by a radiology technologist or a radiologist-a doctor who specializes in x-ray imaging-at a hospital or outpatient center. While sitting or standing in front of an x-ray machine, the patient drinks barium liquid, which is often white and has a chalky consistency and taste. The barium liquid coats the lining of the upper GI tract and makes signs of disease show up more clearly on x rays. X-ray video, called fluoroscopy, is used to view the barium liquid moving through the esophagus, stomach, and duodenum. Additional x rays and fluoroscopy are performed while the patient lies on an x-ray table. To fully coat the upper GI tract with barium liquid, the technologist or radiologist may press on the abdomen or ask the patient to change position. Patients hold still in various positions, allowing the technologist or radiologist to take x rays of the upper GI tract at different angles. If a technologist conducts the upper GI series, a radiologist will later examine the images to look for problems.  This test typically takes about 1 hour to complete. __________________________________________________________________  It has been recommended to you by your physician that you have a(n) colonoscopy completed. Per your request, we did not schedule the procedure(s) today. Please contact our office at [phone]  should you decide to have the procedure completed.  Continue Pantoprazole 40 mg daily   If you are age 26 or older, your body mass index should be between 23-30. Your Body mass index is 23.42 kg/m. If this is out of the aforementioned range listed, please consider follow up with your Primary Care Provider.  If you are age 67 or younger, your body mass index should be between 19-25. Your Body mass index is 23.42 kg/m. If this is out of the aformentioned range listed, please consider follow up with your Primary Care Provider.

## 2018-08-03 NOTE — Progress Notes (Signed)
Referring Provider: Delia Chimes, MD Primary Care Physician:  Eileen Chimes, MD   Reason for Consultation: Esophageal dysphasia   IMPRESSION:  Duodenal ulcer on EGD 04/19/18    - H pylori negative Dysphasia, improved on daily PPI No prior colon cancer screening Shortened EGD due to secretions/pulmonary compromise during the procedure  Clinically improving with weight loss, PPI BID, abstaining from alcohol, and avoiding NSAIDs. Recommend UGI series to assess for healing of the duodenal ulcer.   Colonoscopy recommended for colon cancer screening. Timing is at her convenience.   PLAN: Continue pantoprazole 40 mg daily (refill today) UGI series to follow-up on the ulcer and avoid repeat EGD Continue to abstain from all alcohol - she is congratulated on her weight loss! Avoid all NSAIDs Colonoscopy at her convenience (she would like to schedule this spring)  I consented the patient at the bedside today discussing the risks, benefits, and alternatives to endoscopic evaluation. In particular, we discussed the risks that include, but are not limited to, reaction to medication, cardiopulmonary compromise, bleeding requiring blood transfusion, aspiration resulting in pneumonia, perforation requiring surgery, lack of diagnosis, severe illness requiring hospitalization, and even death. We reviewed the risk of missed lesion including polyps or even cancer. The patient acknowledges these risks and asks that we proceed.  HPI: Eileen Stephenson is a 73 y.o. female returns in follow-up for further evaluation of esophageal dysphasia.  The interval history is obtained through the patient.  She had an EGD revealing an H pylori negative ulcer, PPI BID x 8 weeks. Biopsy results reviewed with the patient: duodenal ulcer, gastritis without H pylori.  Repeat EGD recommended but not planned due to her pulmonary compromise due to secretions during her endoscopy in November.  Stopped drinking almost all  alcohol and has lost 20 pounds. Walking, exercising, going to the [facility] 4 times a week. Rare bourbon. No alcohol. She is afraid to drink alcohol now.    No further dysphagia.  Continues on Protonix 40 mg daily after a twice daily.  Avoids all NSAIDs.   No new complaints or concerns.   She had a screening colonoscopy 10 years ago in [place]. She may have had a polyp removed at that time.  I am unable to locate the procedure note.  Peripheral labs show a normal CMP.  There is no known family history of colon cancer or polyps.   History reviewed. No pertinent past medical history.  Past Surgical History:  Procedure Laterality Date  . BREAST EXCISIONAL BIOPSY Right 1972  . CERVICAL CONIZATION W/BX  1980    Prior to Admission medications   Medication Sig Start Date End Date Taking? Authorizing Provider  cholecalciferol (VITAMIN D) 1000 UNITS tablet Take 1,000 Units by mouth daily.    [provider]  PARoxetine (PAXIL) 10 MG tablet Take 10 mg by mouth every morning.    [provider]    Current Outpatient Medications  Medication Sig Dispense Refill  . pantoprazole (PROTONIX) 40 MG tablet TAKE 1 TABLET(40 MG) BY MOUTH TWICE DAILY (Patient taking differently: 40 mg daily. ) 180 tablet 0  . PARoxetine (PAXIL) 10 MG tablet Take 10 mg by mouth every morning.     No current facility-administered medications for this visit.     Allergies as of 08/03/2018  . (No Known Allergies)    Family History  Problem Relation Age of Onset  . Breast cancer Mother 39  . Cancer Father   . Leukemia Sister  Sister passed away at [facility]  . Colon cancer Neg Hx   . Rectal cancer Neg Hx     Social History   Socioeconomic History  . Marital status: Single    Spouse name: Not on file  . Number of children: 1  . Years of education: Not on file  . Highest education level: Not on file  Occupational History  . Occupation: Self Employed  Social Needs  . Financial  resource strain: Not on file  . Food insecurity:    Worry: Not on file    Inability: Not on file  . Transportation needs:    Medical: Not on file    Non-medical: Not on file  Tobacco Use  . Smoking status: Former Research scientist (life sciences)  . Smokeless tobacco: Never Used  Substance and Sexual Activity  . Alcohol use: Not Currently    Alcohol/week: 2.0 standard drinks    Types: 2 Glasses of wine per week  . Drug use: Yes    Types: Marijuana    Comment: 3 days ago  . Sexual activity: Not on file  Lifestyle  . Physical activity:    Days per week: Not on file    Minutes per session: Not on file  . Stress: Not on file  Relationships  . Social connections:    Talks on phone: Not on file    Gets together: Not on file    Attends religious service: Not on file    Active member of club or organization: Not on file    Attends meetings of clubs or organizations: Not on file    Relationship status: Not on file  . Intimate partner violence:    Fear of current or ex partner: Not on file    Emotionally abused: Not on file    Physically abused: Not on file    Forced sexual activity: Not on file  Other Topics Concern  . Not on file  Social History Narrative   She lives at home and is still working in a [employer]. She is active, gardens, and doesn't use cane or walker. She is not sexually active. She smoked remotely but not heavily. Drinks 3 glasses of wine a night, occasional MJ, but no IVDU.     Review of Systems: 12 system ROS is negative except as noted above.   Physical Exam: Vital signs were reviewed. General:   Alert, well-nourished, pleasant and cooperative in NAD Head:  Normocephalic and atraumatic. Eyes:  Sclera clear, no icterus.   Conjunctiva pink. Mouth:  No deformity or lesions.   Neck:  Supple; no thyromegaly. No LAD.  Lungs:  Clear throughout to auscultation.   No wheezes. Heart:  Regular rate and rhythm; no murmurs Abdomen:  Soft, nontender, normal bowel sounds. No rebound or  guarding. No hepatosplenomegaly Neurologic:  Alert and  oriented x4;  grossly nonfocal Skin:  No rash or bruise. Psych:  Alert and cooperative. Normal mood and affect.   Eileen Stephenson L. Tarri Glenn Md, MPH [facility] 08/14/2018, 9:54 PM

## 2018-08-09 ENCOUNTER — Encounter: Payer: Self-pay | Admitting: Gastroenterology

## 2018-08-18 ENCOUNTER — Ambulatory Visit (HOSPITAL_COMMUNITY): Payer: PPO

## 2018-08-24 ENCOUNTER — Telehealth: Payer: Self-pay | Admitting: Gastroenterology

## 2018-08-24 NOTE — Telephone Encounter (Signed)
Pls call pt, she is confused about what medication she needs to take, she just picked up a rf for famotidine but she thought that she was supposed to take pantoprazole as usual.

## 2018-08-24 NOTE — Telephone Encounter (Signed)
She should continue pantoprazole 40 mg daily until her UGI series results are available. Then, we may be able to convert her to famotidine. Thank you.

## 2018-08-24 NOTE — Telephone Encounter (Signed)
Dr. Tarri Glenn,   Patient is confused she is taking Pantoprazole 40 mg daily and her PCP put her on famotidine 20 mg daily. Does she need to take both of these?

## 2019-03-07 ENCOUNTER — Other Ambulatory Visit: Payer: Self-pay | Admitting: Nurse Practitioner

## 2019-03-07 DIAGNOSIS — Z1231 Encounter for screening mammogram for malignant neoplasm of breast: Secondary | ICD-10-CM

## 2019-04-22 ENCOUNTER — Ambulatory Visit
Admission: RE | Admit: 2019-04-22 | Discharge: 2019-04-22 | Disposition: A | Discharge status: Home / Self Care | Payer: PPO | Source: Ambulatory Visit | Attending: Nurse Practitioner | Admitting: Nurse Practitioner

## 2019-04-22 ENCOUNTER — Other Ambulatory Visit: Payer: Self-pay

## 2019-04-22 DIAGNOSIS — Z1231 Encounter for screening mammogram for malignant neoplasm of breast: Secondary | ICD-10-CM | POA: Diagnosis not present

## 2019-06-14 DIAGNOSIS — F418 Other specified anxiety disorders: Secondary | ICD-10-CM | POA: Diagnosis not present

## 2019-06-14 DIAGNOSIS — Z20822 Contact with and (suspected) exposure to covid-19: Secondary | ICD-10-CM | POA: Diagnosis not present

## 2019-06-17 DIAGNOSIS — Z20822 Contact with and (suspected) exposure to covid-19: Secondary | ICD-10-CM | POA: Diagnosis not present

## 2019-08-06 IMAGING — MG DIGITAL SCREENING BILATERAL MAMMOGRAM WITH TOMO AND CAD
8 series · 9 of 24 positions shown · non-contrast
Comparison: Previous exam(s).

CLINICAL DATA: Screening.

EXAM:
DIGITAL SCREENING BILATERAL MAMMOGRAM WITH TOMO AND CAD

[L CC synth-2D]
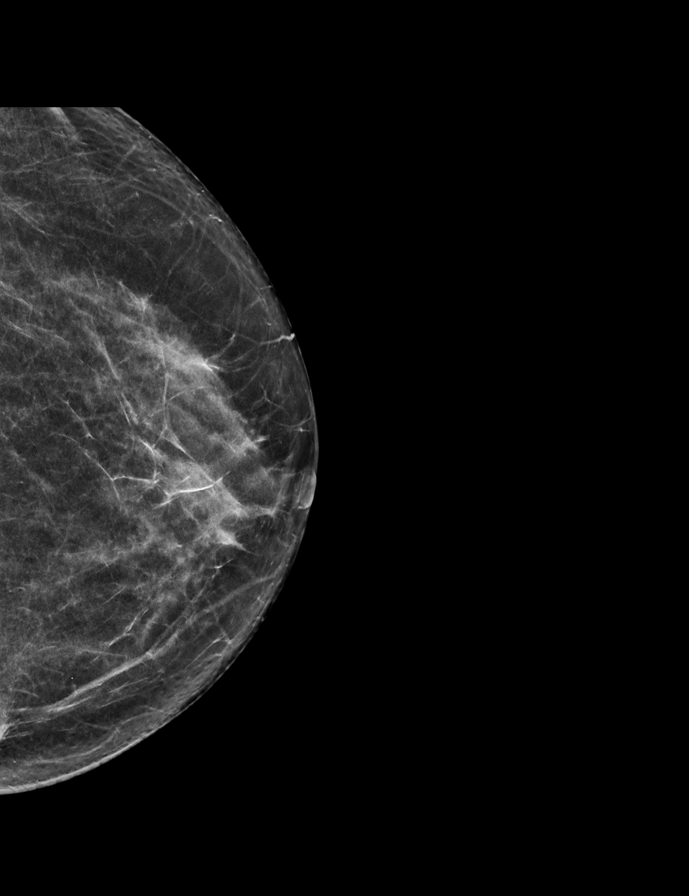

[L MLO synth-2D]
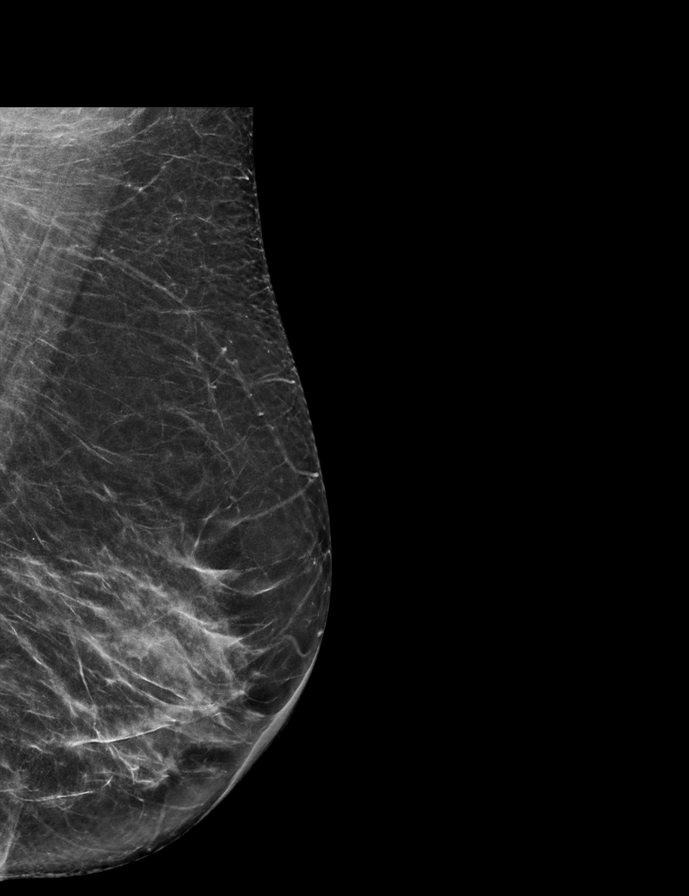

[R CC synth-2D]
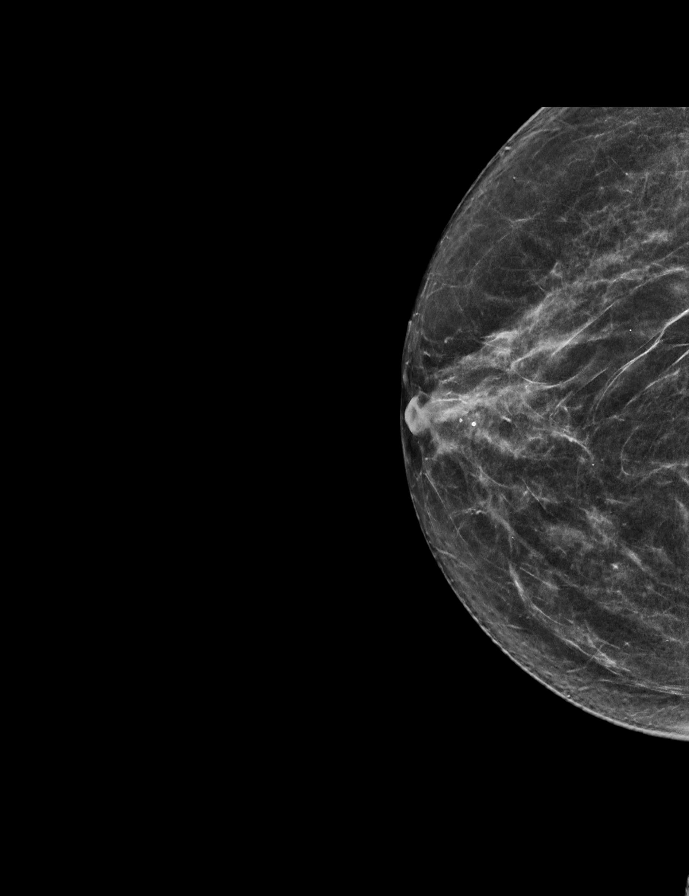

[R MLO synth-2D]
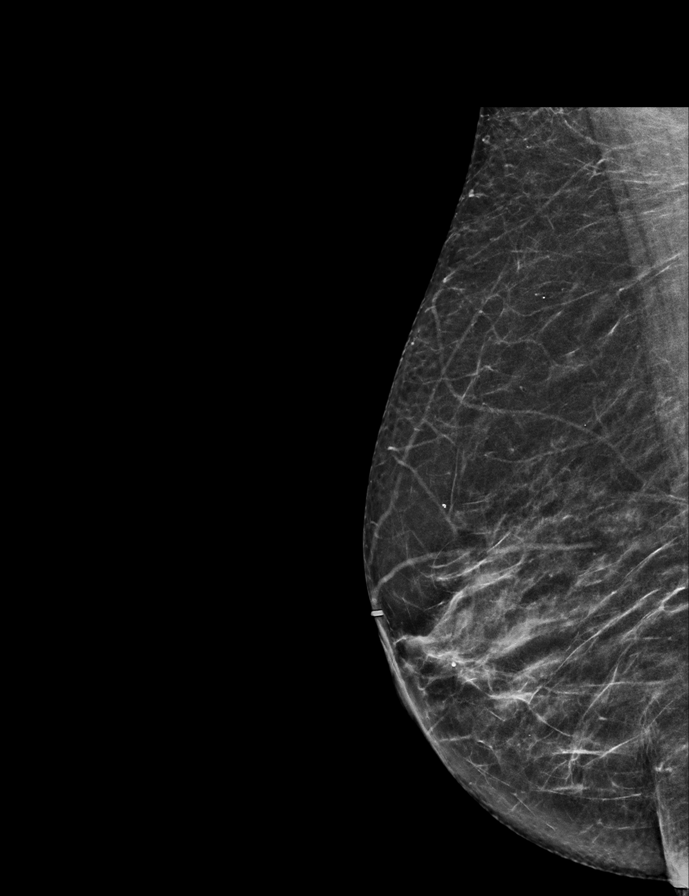

[R CC tomo · 2 of 59 frames shown]
[frame 20/59]
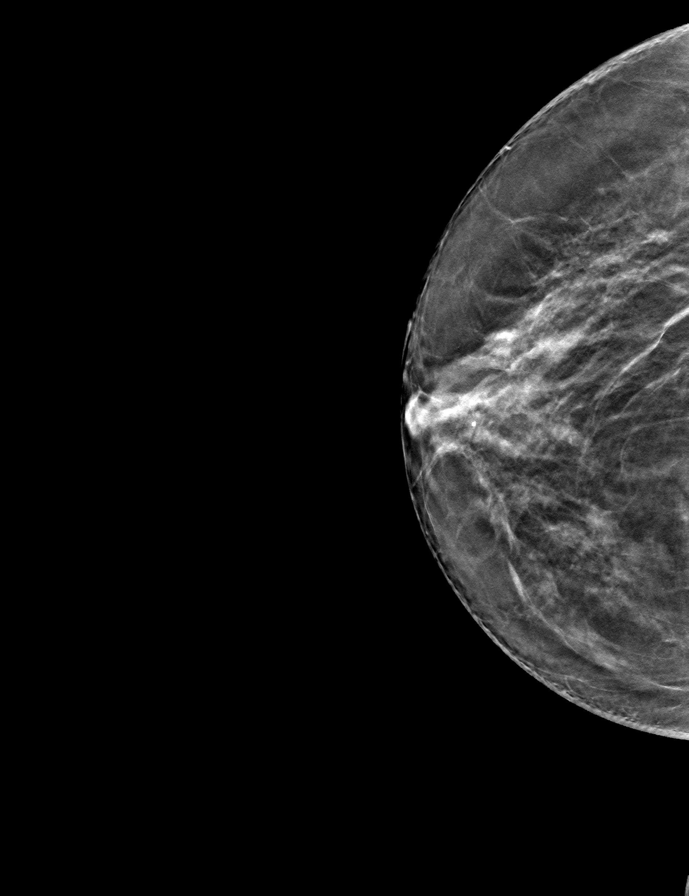
[frame 30/59]
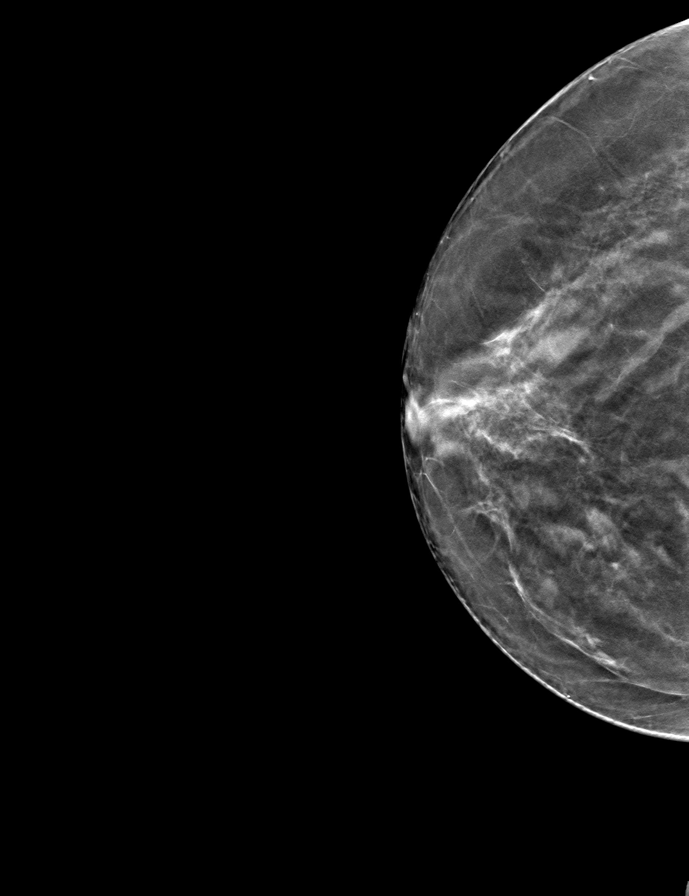

[L CC tomo · tomo slice 31/62.0]
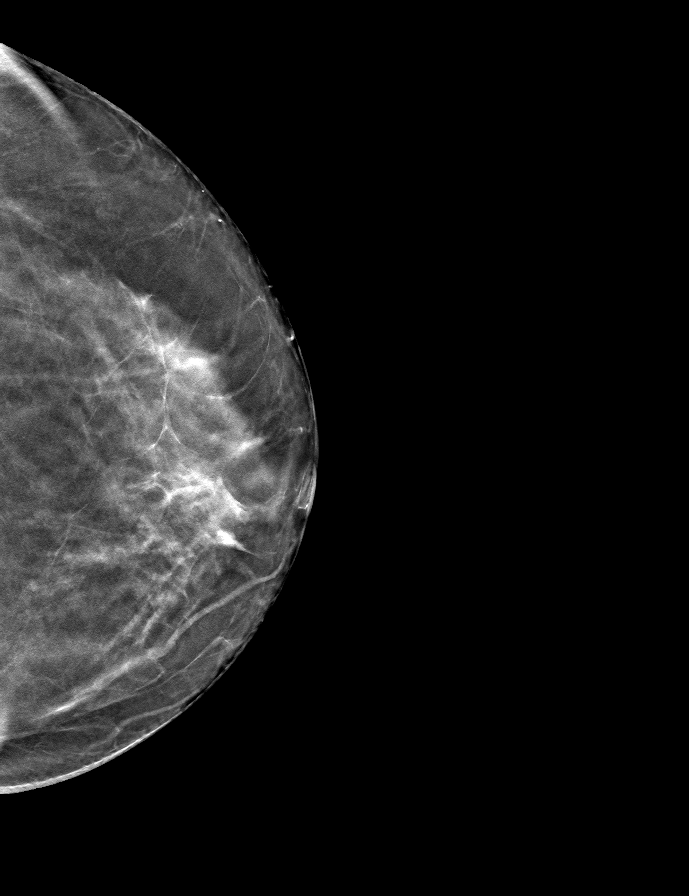

[R MLO tomo · tomo slice 30/59.0]
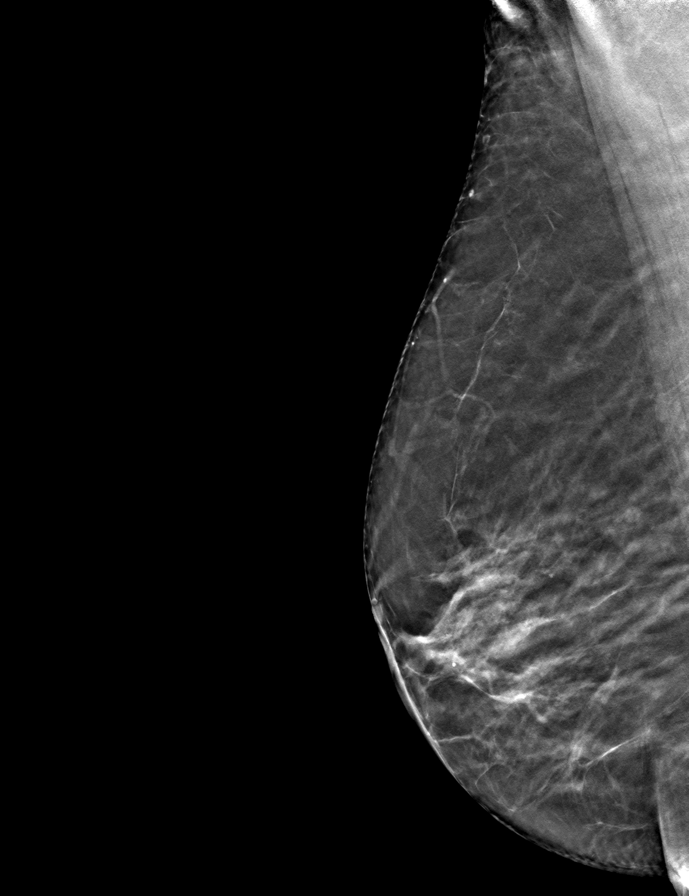

[L MLO tomo · tomo slice 33/64.0]
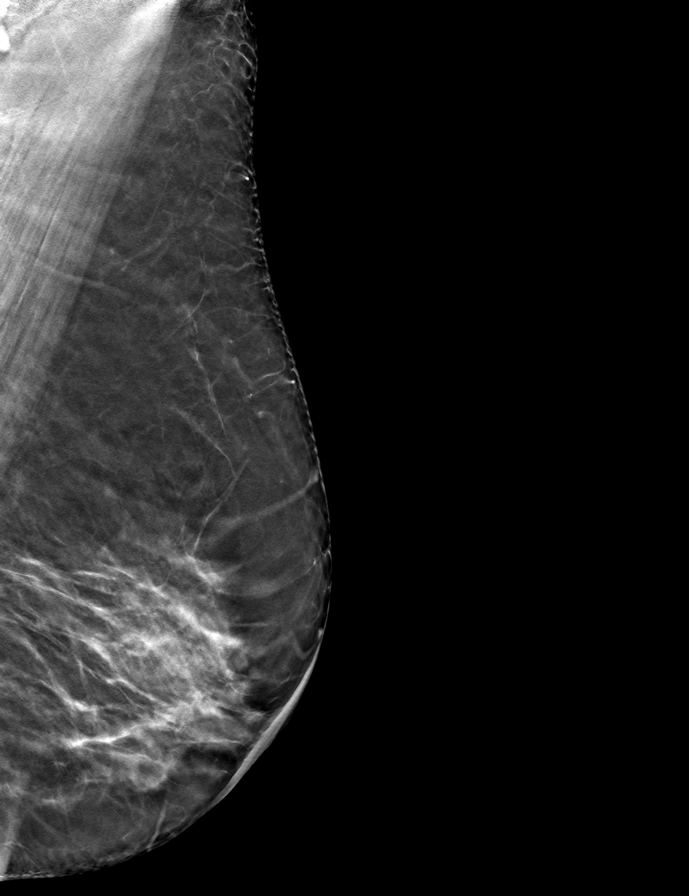

[9 of 24 positions shown; findings below may reference images not displayed]

ACR Breast Density Category c: The breast tissue is heterogeneously
dense, which may obscure small masses.
FINDINGS: There are no findings suspicious for malignancy. Images were
processed with CAD.
IMPRESSION: No mammographic evidence of malignancy. A result letter of this
screening mammogram will be mailed directly to the patient.

RECOMMENDATION:
Screening mammogram in one year. (Code:FT-U-LHB)

BI-RADS CATEGORY  1: Negative.

## 2020-02-14 DIAGNOSIS — Z1211 Encounter for screening for malignant neoplasm of colon: Secondary | ICD-10-CM | POA: Diagnosis not present

## 2020-02-14 DIAGNOSIS — K5730 Diverticulosis of large intestine without perforation or abscess without bleeding: Secondary | ICD-10-CM | POA: Diagnosis not present

## 2020-02-14 DIAGNOSIS — K635 Polyp of colon: Secondary | ICD-10-CM | POA: Diagnosis not present

## 2020-02-14 DIAGNOSIS — D122 Benign neoplasm of ascending colon: Secondary | ICD-10-CM | POA: Diagnosis not present

## 2021-02-14 ENCOUNTER — Encounter (HOSPITAL_COMMUNITY): Payer: Self-pay

## 2021-02-14 ENCOUNTER — Other Ambulatory Visit: Payer: Self-pay

## 2021-02-14 ENCOUNTER — Ambulatory Visit (HOSPITAL_COMMUNITY)
Admission: EM | Admit: 2021-02-14 | Discharge: 2021-02-14 | Disposition: A | Discharge status: Home / Self Care | Payer: PPO | Attending: Family Medicine | Admitting: Family Medicine

## 2021-02-14 ENCOUNTER — Ambulatory Visit (INDEPENDENT_AMBULATORY_CARE_PROVIDER_SITE_OTHER): Payer: PPO

## 2021-02-14 DIAGNOSIS — S61232A Puncture wound without foreign body of right middle finger without damage to nail, initial encounter: Secondary | ICD-10-CM

## 2021-02-14 DIAGNOSIS — M79644 Pain in right finger(s): Secondary | ICD-10-CM | POA: Diagnosis not present

## 2021-02-14 DIAGNOSIS — S6991XA Unspecified injury of right wrist, hand and finger(s), initial encounter: Secondary | ICD-10-CM

## 2021-02-14 NOTE — ED Triage Notes (Signed)
Pt presents with complaints of foreign body in left middle finger. Reports she has an [place] and her finger got caught. She says the needle broke into her finger and she could not get it out.

## 2021-02-14 NOTE — ED Provider Notes (Signed)
[facility]   [id number] 02/14/21 Arrival Time: [place]:  1. Pain of right middle finger   2. Fingernail injury, right, initial encounter    No foreign body on imaging as interpreted by me. Discussed. Should heal well.  May f/u as needed.   Follow-up Information     Weber, Andree Moro, MD.   Specialty: Endocrinology Why: As needed. Contact information: [address] [place] [address] [phone]                 Reviewed expectations re: course of current medical issues. Questions answered. Outlined signs and symptoms indicating need for more acute intervention. Understanding verbalized. After Visit Summary given.   SUBJECTIVE: History from: patient. Eileen Stephenson is a 75 y.o. female who feels she may have broken embroidery needle in her R 3rd finger. Today when finger caught in machine. Minimal bleeding; controlled. No extremity sensation changes or weakness.  OBJECTIVE:  Vitals:   02/14/21 1556  BP: 120/76  Pulse: (!) 52  Resp: 16  Temp: 97.9 F (36.6 C)  TempSrc: Oral  SpO2: 97%    General appearance: alert; no distress RUE: puncture of R 3rd fingernail; no bleeding; no significant subungual hematoma; non-tender; normal capillary refill and distal sensation Psychological: alert and cooperative; normal mood and affect  Imaging: DG Finger Middle Right  Result Date: 02/14/2021 CLINICAL DATA:  Possible foreign body EXAM: RIGHT MIDDLE FINGER 2+V COMPARISON:  05/25/2017 FINDINGS: There is no evidence of fracture or dislocation. There is no evidence of arthropathy or other focal bone abnormality. Soft tissues are unremarkable. IMPRESSION: Negative. Electronically Signed   By: Donavan Foil M.D.   On: 02/14/2021 16:37    No Known Allergies  History reviewed. No pertinent past medical history. Social History   Socioeconomic History   Marital status: Single    Spouse name: Not on file   Number of children:  1   Years of education: Not on file   Highest education level: Not on file  Occupational History   Occupation: Self Employed  Tobacco Use   Smoking status: Former   Smokeless tobacco: Never  Vaping Use   Vaping Use: Former   Quit date: 04/19/1988  Substance and Sexual Activity   Alcohol use: Not Currently    Alcohol/week: 2.0 standard drinks    Types: 2 Glasses of wine per week   Drug use: Yes    Types: Marijuana    Comment: 3 days ago   Sexual activity: Not on file  Other Topics Concern   Not on file  Social History Narrative   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 She is active, gardens, and doesn't use cane or walker. She is not sexually active. She smoked remotely but not heavily. Drinks 3 glasses of wine a night, occasional MJ, but no IVDU.    Social Determinants of Health   Financial Resource Strain: Not on file  Food Insecurity: Not on file  Transportation Needs: Not on file  Physical Activity: Not on file  Stress: Not on file  Social Connections: Not on file  Intimate Partner Violence: Not on file   Family History  Problem Relation Age of Onset   Breast cancer Mother 35   Cancer Father    Leukemia Sister        Sister passed away at [place] Hx    Rectal cancer Neg Hx  Past Surgical History:  Procedure Laterality Date   BREAST EXCISIONAL BIOPSY Right 1972   CERVICAL CONIZATION W/BX  1980     Vanessa Kick, MD 02/14/21 1652

## 2021-03-05 ENCOUNTER — Other Ambulatory Visit: Payer: Self-pay | Admitting: Nurse Practitioner

## 2021-03-05 DIAGNOSIS — Z1231 Encounter for screening mammogram for malignant neoplasm of breast: Secondary | ICD-10-CM

## 2021-04-02 ENCOUNTER — Other Ambulatory Visit: Payer: Self-pay

## 2021-04-02 ENCOUNTER — Ambulatory Visit
Admission: RE | Admit: 2021-04-02 | Discharge: 2021-04-02 | Disposition: A | Discharge status: Home / Self Care | Payer: PPO | Source: Ambulatory Visit | Attending: Nurse Practitioner | Admitting: Nurse Practitioner

## 2021-04-02 DIAGNOSIS — Z1231 Encounter for screening mammogram for malignant neoplasm of breast: Secondary | ICD-10-CM

## 2021-11-07 ENCOUNTER — Other Ambulatory Visit: Payer: Self-pay | Admitting: Physician Assistant

## 2021-11-07 DIAGNOSIS — M8580 Other specified disorders of bone density and structure, unspecified site: Secondary | ICD-10-CM

## 2021-11-07 DIAGNOSIS — Z1231 Encounter for screening mammogram for malignant neoplasm of breast: Secondary | ICD-10-CM

## 2022-04-20 ENCOUNTER — Ambulatory Visit (INDEPENDENT_AMBULATORY_CARE_PROVIDER_SITE_OTHER): Payer: PPO

## 2022-04-20 ENCOUNTER — Ambulatory Visit (HOSPITAL_COMMUNITY)
Admission: EM | Admit: 2022-04-20 | Discharge: 2022-04-20 | Disposition: A | Discharge status: Home / Self Care | Payer: PPO | Attending: Family Medicine | Admitting: Family Medicine

## 2022-04-20 ENCOUNTER — Encounter (HOSPITAL_COMMUNITY): Payer: Self-pay

## 2022-04-20 DIAGNOSIS — S63502A Unspecified sprain of left wrist, initial encounter: Secondary | ICD-10-CM

## 2022-04-20 DIAGNOSIS — R0782 Intercostal pain: Secondary | ICD-10-CM

## 2022-04-20 DIAGNOSIS — W19XXXA Unspecified fall, initial encounter: Secondary | ICD-10-CM

## 2022-04-20 DIAGNOSIS — M25532 Pain in left wrist: Secondary | ICD-10-CM | POA: Diagnosis not present

## 2022-04-20 NOTE — ED Triage Notes (Signed)
Pt states she fell yesterday.  C/O pain and swelling to left wrist and left ribs.  Left wrist swollen and bruising notes as well as bruising to left eye. Has been taking ibuprofen and using ice with some relief.

## 2022-04-20 NOTE — Discharge Instructions (Addendum)
Please schedule a follow-up with your PCP on radiology findings.   An Ace bandage was provided in office.   Rest, ice, compression, and elevation can help with symptom management.  It will help you to rest the affected area.  Use ice to help with inflammation at the site of injury.  You may use a compression bandage or sleeve to provide support for the affected area.  Providing elevation for the affected extremity when able can aid healing.   You can take ibuprofen every 6 hours (400-600 mg) , do not take more than 2400 mg in a 24-hour day.  I advised that you do not take ibuprofen on an empty stomach, ibuprofen can cause GI problems such as GI bleeding.   Please follow-up with your PCP if any new symptoms occur

## 2022-04-20 NOTE — ED Provider Notes (Signed)
[facility]    CSN: [id number] Arrival date & time: 04/20/22  1117      History   Chief Complaint Chief Complaint  Patient presents with   Wrist Pain    HPI Eileen Stephenson is a 76 y.o. female.  Patient presents due to having a fall yesterday.  Patient reports that she was walking outside when she fell onto the pavement.  Patient reports that her glasses was pushed into the left side of her face and her left hand was put out to brace the fall.  Patient reports swelling to the left wrist and bruising.  Patient denies any loss of consciousness .  Patient reports that she is feeling well apart from having bruising and swelling to left wrist. Patient denies any numbness or tingling in left extremity. She reports having a previous fracture in her right wrist.  She reports bruising to left eye, but she denies any pain around the eye.  She denies any changes to her vision.  She denies any headache or dizziness. Patient is ambulatory while in clinic.   Patient has taken ibuprofen and used ice with some relief of symptoms.  Patient denies any blood thinner use.   Patient reports left-sided rib tenderness after fall.  Patient reports pain with inhalation left side rib is located.  Patient denies any difficulty breathing or shortness of breath.    Wrist Pain Pertinent negatives include no headaches.    History reviewed. No pertinent past medical history.  Patient Active Problem List   Diagnosis Date Noted   Acute gout 10/19/2011   Viral syndrome 10/19/2011   Viral exanthem 10/19/2011   Thrombocytopenia ([place]) 10/18/2011   Leukocytosis 10/18/2011   Hyponatremia 10/18/2011   ARF (acute renal failure) ([place]) 10/18/2011   Transaminitis 10/18/2011   Hypotension 10/18/2011    Past Surgical History:  Procedure Laterality Date   BREAST EXCISIONAL BIOPSY Right 1972   CERVICAL CONIZATION W/BX  1980    OB History   No obstetric history on file.      Home Medications     Prior to Admission medications   Medication Sig Start Date End Date Taking? Authorizing Provider  pantoprazole (PROTONIX) 40 MG tablet TAKE 1 TABLET(40 MG) BY MOUTH TWICE DAILY Patient taking differently: 40 mg daily.  04/19/18   Thornton Park, MD  PARoxetine (PAXIL) 10 MG tablet Take 10 mg by mouth every morning.    [provider]    Family History Family History  Problem Relation Age of Onset   Breast cancer Mother 104   Cancer Father    Leukemia Sister        Sister passed away at [employer]   Colon cancer Neg Hx    Rectal cancer Neg Hx     Social History Social History   Tobacco Use   Smoking status: Former   Smokeless tobacco: Never  Vaping Use   Vaping Use: Former   Quit date: 04/19/1988  Substance Use Topics   Alcohol use: Not Currently    Alcohol/week: 2.0 standard drinks of alcohol    Types: 2 Glasses of wine per week   Drug use: Yes    Types: Marijuana    Comment: 3 days ago     Allergies   Patient has no known allergies.   Review of Systems Review of Systems  Constitutional:  Negative for activity change.  Eyes:  Negative for photophobia, pain and visual disturbance.  Musculoskeletal:  Positive for joint swelling. Negative for back  pain, gait problem and neck pain.       Swelling to left wrist   Skin:  Positive for color change.       Reports bruising to left wrist and area around left eye.   Neurological:  Negative for dizziness, seizures, syncope, weakness, light-headedness, numbness and headaches.     Physical Exam Triage Vital Signs ED Triage Vitals  Enc Vitals Group     BP 04/20/22 1158 (!) 146/80     Pulse Rate 04/20/22 1158 66     Resp 04/20/22 1158 16     Temp 04/20/22 1158 98 F (36.7 C)     Temp Source 04/20/22 1158 Oral     SpO2 04/20/22 1158 99 %     Weight --      Height --      Head Circumference --      Peak Flow --      Pain Score 04/20/22 1200 6     Pain Loc --      Pain Edu? --      Excl. in [place]? --    No  data found.  Updated Vital Signs BP (!) 146/80 (BP Location: Right Arm)   Pulse 66   Temp 98 F (36.7 C) (Oral)   Resp 16   SpO2 99%     Physical Exam Vitals and nursing note reviewed.  Constitutional:      Appearance: Normal appearance.  HENT:     Head:      Comments: Bruising present within left periorbital area, no tenderness upon palpation.  No erythema present.  Cardiovascular:     Pulses:          Radial pulses are 2+ on the left side.  Musculoskeletal:     Right wrist: Normal.     Left wrist: Swelling present. No deformity, effusion, lacerations, tenderness, bony tenderness, snuff box tenderness or crepitus. Normal range of motion. Normal pulse.     Left hand: No swelling, deformity, lacerations, tenderness or bony tenderness. Normal range of motion. Normal strength. Normal sensation. There is no disruption of two-point discrimination. Normal capillary refill. Normal pulse.     Comments: Bruising present to anterior and posterior wrist.  No tenderness upon palpation.  Normal grip strength.   Bruising present to the dorsal aspect of hand proximally.   Skin:    General: Skin is warm.     Capillary Refill: Capillary refill takes less than 2 seconds.     Findings: Bruising present.  Neurological:     Mental Status: She is alert and oriented to person, place, and time.     Sensory: Sensation is intact.     Motor: Motor function is intact.     Coordination: Coordination is intact.     Gait: Gait is intact.      UC Treatments / Results  Labs (all labs ordered are listed, but only abnormal results are displayed) Labs Reviewed - No data to display  EKG   Radiology DG Chest 2 View  Result Date: 04/20/2022 CLINICAL DATA:  Right lung opacity seen on rib series EXAM: CHEST - 2 VIEW COMPARISON:  Same-day rib series.  Chest x-ray 10/18/2011 FINDINGS: The heart size and mediastinal contours are within normal limits. Patchy right lower lobe airspace opacity. Left lung is  clear. No pleural effusion or pneumothorax. The visualized skeletal structures are unremarkable. IMPRESSION: Patchy right lower lobe airspace opacity, suspicious for pneumonia. Radiographic follow-up to resolution is recommended. Electronically Signed   By:  Nicholas  Plundo D.O.   On: 04/20/2022 13:46   DG Ribs Unilateral Left  Result Date: 04/20/2022 CLINICAL DATA:  Pain after fall EXAM: LEFT RIBS - 2 VIEW COMPARISON:  None Available. FINDINGS: Opacity projected over the medial right lung base could represent infiltrate or confluence of shadows. Visualized lungs/soft tissues of the chest otherwise normal. No pneumothorax. No rib fractures are noted. IMPRESSION: 1. No rib fractures identified.  No pneumothorax. 2. Opacity projected over the medial right lung base could represent infiltrate or confluence of shadows. A dedicated chest x-ray could better evaluate this region, only partially evaluated on this study. Electronically Signed   By: Dorise Bullion III M.D.   On: 04/20/2022 12:52   DG Wrist Complete Left  Result Date: 04/20/2022 CLINICAL DATA:  [place] yesterday. Pain. Swelling and bruising in wrist. EXAM: LEFT WRIST - COMPLETE 3+ VIEW COMPARISON:  None Available. FINDINGS: Soft tissue swelling around the wrist. Mild deformity of the distal radius is consistent with healing of the fracture identified on the November 15, 2005 study. No convincing evidence of acute fracture on today's study. No wrist dislocation. The carpal bones and visualized metacarpal bones are intact. IMPRESSION: 1. Soft tissue swelling in the wrist. 2. Mild deformity of the distal radius is thought to represent healing of the fracture identified on the November 15, 2005 study. No convincing evidence of acute fracture today. If concern persists, the wrist could be splinted with follow-up imaging in 7-14 days. 3. No other significant abnormalities. Electronically Signed   By: Dorise Bullion III M.D.   On: 04/20/2022 12:50     Procedures Procedures (including critical care time)  Medications Ordered in UC Medications - No data to display  Initial Impression / Assessment and Plan / UC Course  I have reviewed the triage vital signs and the nursing notes.  Pertinent labs & imaging results that were available during my care of the patient were reviewed by me and considered in my medical decision making (see chart for details).     Patient was for sprain of left wrist and a fall .Left wrist x-ray performed in office was negative for an acute fracture, signs of previous (old) fracture from 2007 noted by radiologist, patient made aware that if symptoms persist that she would need to follow-up with an orthopedist per radiologist recommendation.  Patient given information for an orthopedist on her discharge paperwork.  Ace wrap was provided in office.  Patient was given information on symptom management.  Low suspicion of any orbital fracture, no tenderness upon palpation.  Facial bruising appears superficial and resulted due to fall.  Patient was made aware that she can use ice on the site to help with bruising.  Left rib x-ray was negative for fracture, radiologist saw concern right sided base of lung therefore a repeat x-ray of the whole chest was ordered.  Chest x-ray showed patchy opacity that was suspicious for pneumonia , with recommendation for follow-up imaging.  Low suspicion of pneumonia, patient is well-appearing and has no respiratory complaints.  Patient was made aware that she should schedule a follow-up with her PCP for repeat imaging performed.  Patient verbalized that she will schedule an appointment tomorrow.  Patient was made aware of signs and symptoms of pneumonia and to follow-up immediately if any of these occur.  Patient verbalized understanding of instructions.   Charting was provided using a a verbal dictation system, charting was proofread for errors, errors may occur which could change the meaning  of  the information charted.   Final Clinical Impressions(s) / UC Diagnoses   Final diagnoses:  Sprain of left wrist, initial encounter  Fall, initial encounter     Discharge Instructions      Please schedule a follow-up with your PCP on radiology findings.   An Ace bandage was provided in office.   Rest, ice, compression, and elevation can help with symptom management.  It will help you to rest the affected area.  Use ice to help with inflammation at the site of injury.  You may use a compression bandage or sleeve to provide support for the affected area.  Providing elevation for the affected extremity when able can aid healing.   You can take ibuprofen every 6 hours (400-600 mg) , do not take more than 2400 mg in a 24-hour day.  I advised that you do not take ibuprofen on an empty stomach, ibuprofen can cause GI problems such as GI bleeding.   Please follow-up with your PCP if any new symptoms occur     ED Prescriptions   None    PDMP not reviewed this encounter.   Flossie Dibble, NP 04/20/22 1453

## 2022-06-13 ENCOUNTER — Ambulatory Visit
Admission: RE | Admit: 2022-06-13 | Discharge: 2022-06-13 | Disposition: A | Discharge status: Home / Self Care | Payer: PPO | Source: Ambulatory Visit | Attending: Physician Assistant | Admitting: Physician Assistant

## 2022-06-13 DIAGNOSIS — Z1231 Encounter for screening mammogram for malignant neoplasm of breast: Secondary | ICD-10-CM

## 2022-06-13 DIAGNOSIS — M8580 Other specified disorders of bone density and structure, unspecified site: Secondary | ICD-10-CM

## 2022-11-15 ENCOUNTER — Ambulatory Visit (HOSPITAL_COMMUNITY)
Admission: EM | Admit: 2022-11-15 | Discharge: 2022-11-15 | Disposition: A | Discharge status: Home / Self Care | Payer: PPO | Attending: Emergency Medicine | Admitting: Emergency Medicine

## 2022-11-15 ENCOUNTER — Encounter (HOSPITAL_COMMUNITY): Payer: Self-pay

## 2022-11-15 DIAGNOSIS — T887XXA Unspecified adverse effect of drug or medicament, initial encounter: Secondary | ICD-10-CM | POA: Diagnosis not present

## 2022-11-15 DIAGNOSIS — R42 Dizziness and giddiness: Secondary | ICD-10-CM | POA: Diagnosis not present

## 2022-11-15 LAB — COMPREHENSIVE METABOLIC PANEL
ALT: 16 U/L (ref 0–44)
AST: 29 U/L (ref 15–41)
Albumin: 4 g/dL (ref 3.5–5.0)
Alkaline Phosphatase: 60 U/L (ref 38–126)
Anion gap: 10 (ref 5–15)
BUN: 14 mg/dL (ref 8–23)
CO2: 26 mmol/L (ref 22–32)
Calcium: 9.2 mg/dL (ref 8.9–10.3)
Chloride: 103 mmol/L (ref 98–111)
Creatinine, Ser: 0.66 mg/dL (ref 0.44–1.00)
GFR, Estimated: >60 mL/min (ref >60)
Glucose, Bld: 83 mg/dL (ref 70–99)
Potassium: 4.3 mmol/L (ref 3.5–5.1)
Sodium: 139 mmol/L (ref 135–145)
Total Bilirubin: 0.4 mg/dL (ref 0.3–1.2)
Total Protein: 7.2 g/dL (ref 6.5–8.1)

## 2022-11-15 LAB — CBC WITH DIFFERENTIAL/PLATELET
Abs Immature Granulocytes: 0.02 10*3/uL (ref 0.00–0.07)
Basophils Absolute: 0.1 10*3/uL (ref 0.0–0.1)
Basophils Relative: 1 %
Eosinophils Absolute: 0.5 10*3/uL (ref 0.0–0.5)
Eosinophils Relative: 6 %
HCT: 41 % (ref 36.0–46.0)
Hemoglobin: 13.4 g/dL (ref 12.0–15.0)
Immature Granulocytes: 0 %
Lymphocytes Relative: 37 %
Lymphs Abs: 3.2 10*3/uL (ref 0.7–4.0)
MCH: 29.6 pg (ref 26.0–34.0)
MCHC: 32.7 g/dL (ref 30.0–36.0)
MCV: 90.7 fL (ref 80.0–100.0)
Monocytes Absolute: 0.7 10*3/uL (ref 0.1–1.0)
Monocytes Relative: 8 %
Neutro Abs: 4.2 10*3/uL (ref 1.7–7.7)
Neutrophils Relative %: 48 %
Platelets: 365 10*3/uL (ref 150–400)
RBC: 4.52 MIL/uL (ref 3.87–5.11)
RDW: 13.5 % (ref 11.5–15.5)
WBC: 8.6 10*3/uL (ref 4.0–10.5)
nRBC: 0 % (ref 0.0–0.2)

## 2022-11-15 LAB — POCT URINALYSIS DIP (MANUAL ENTRY)
Bilirubin, UA: NEGATIVE
Blood, UA: NEGATIVE
Glucose, UA: NEGATIVE mg/dL
Ketones, POC UA: NEGATIVE mg/dL
Leukocytes, UA: NEGATIVE
Nitrite, UA: NEGATIVE
Protein Ur, POC: NEGATIVE mg/dL
Spec Grav, UA: 1.02 (ref 1.010–1.025)
Urobilinogen, UA: 0.2 E.U./dL
pH, UA: 7.5 (ref 5.0–8.0)

## 2022-11-15 NOTE — ED Provider Notes (Signed)
[facility]    CSN: [id number] Arrival date & time: 11/15/22  1353     History   Chief Complaint Chief Complaint  Patient presents with   Dizziness    HPI Eileen Stephenson is a 77 y.o. female. Feeling lightheaded. Started after taking mucinex. She had some nasal congestion and was trying this med. Switched to the [employer] brand and reports taking two of the 1,200 mg tablets Thursday morning. Lightheaded sensation started after this big dose. She has not taken any more medicine since. She reports feeling better today but still having the sensation  No other symptoms.  Denies fever/chills, headache, ear pain or pressure, hearing or vision changes, cough, sore throat, chest pain, shortness of breath, abdominal pain, vomiting/diarrhea, rash, weakness, numbness/tingling.  No head injury  History reviewed. No pertinent past medical history.  Patient Active Problem List   Diagnosis Date Noted   Acute gout 10/19/2011   Viral syndrome 10/19/2011   Viral exanthem 10/19/2011   Thrombocytopenia (HCC) 10/18/2011   Leukocytosis 10/18/2011   Hyponatremia 10/18/2011   ARF (acute renal failure) (HCC) 10/18/2011   Transaminitis 10/18/2011   Hypotension 10/18/2011    Past Surgical History:  Procedure Laterality Date   BREAST EXCISIONAL BIOPSY Right 1972   CERVICAL CONIZATION W/BX  1980    OB History   No obstetric history on file.      Home Medications    Prior to Admission medications   Medication Sig Start Date End Date Taking? Authorizing Provider  PARoxetine (PAXIL) 10 MG tablet Take 10 mg by mouth every morning.   Yes [provider]  pantoprazole (PROTONIX) 40 MG tablet TAKE 1 TABLET(40 MG) BY MOUTH TWICE DAILY Patient taking differently: 40 mg daily. 04/19/18   Tressia Danas, MD    Family History Family History  Problem Relation Age of Onset   Breast cancer Mother 60   Cancer Father    Leukemia Sister        Sister passed away at [employer]    Colon cancer Neg Hx    Rectal cancer Neg Hx     Social History Social History   Tobacco Use   Smoking status: Former   Smokeless tobacco: Never  Vaping Use   Vaping Use: Former   Quit date: 04/19/1988  Substance Use Topics   Alcohol use: Not Currently    Alcohol/week: 2.0 standard drinks of alcohol    Types: 2 Glasses of wine per week   Drug use: Yes    Types: Marijuana    Comment: 3 days ago     Allergies   Patient has no known allergies.   Review of Systems Review of Systems Per HPI  Physical Exam Triage Vital Signs ED Triage Vitals  Enc Vitals Group     BP 11/15/22 1433 125/85     Pulse Rate 11/15/22 1433 61     Resp 11/15/22 1433 19     Temp 11/15/22 1433 98.2 F (36.8 C)     Temp Source 11/15/22 1433 Oral     SpO2 11/15/22 1433 94 %     Weight 11/15/22 1432 136 lb (61.7 kg)     Height --      Head Circumference --      Peak Flow --      Pain Score --      Pain Loc --      Pain Edu? --      Excl. in GC? --    No data  found.  Updated Vital Signs BP 125/85 (BP Location: Left Arm)   Pulse 61   Temp 98.2 F (36.8 C) (Oral)   Resp 19   Wt 136 lb (61.7 kg)   SpO2 94%   BMI 22.98 kg/m    Physical Exam Vitals and nursing note reviewed.  Constitutional:      General: She is not in acute distress.    Appearance: She is not ill-appearing.  HENT:     Head: Normocephalic and atraumatic.     Ears:     Comments: Wax bilaterally     Nose: Nose normal.     Mouth/Throat:     Mouth: Mucous membranes are moist.     Pharynx: Oropharynx is clear.  Eyes:     Extraocular Movements: Extraocular movements intact.     Right eye: Normal extraocular motion and no nystagmus.     Left eye: Normal extraocular motion and no nystagmus.     Conjunctiva/sclera: Conjunctivae normal.     Pupils: Pupils are equal, round, and reactive to light.  Cardiovascular:     Rate and Rhythm: Normal rate and regular rhythm.     Heart sounds: Normal heart sounds.  Pulmonary:      Effort: Pulmonary effort is normal. No respiratory distress.     Breath sounds: Normal breath sounds.  Abdominal:     Palpations: Abdomen is soft.     Tenderness: There is no abdominal tenderness.  Musculoskeletal:        General: Normal range of motion.     Cervical back: Normal range of motion.  Skin:    General: Skin is warm and dry.  Neurological:     General: No focal deficit present.     Mental Status: She is alert and oriented to person, place, and time.     Cranial Nerves: Cranial nerves 2-12 are intact. No cranial nerve deficit.     Sensory: Sensation is intact.     Motor: Motor function is intact. No weakness.     Coordination: Coordination is intact.     Gait: Gait is intact.     Comments: Strength and sensation intact     UC Treatments / Results  Labs (all labs ordered are listed, but only abnormal results are displayed) Labs Reviewed  POCT URINALYSIS DIP (MANUAL ENTRY) - Abnormal; Notable for the following components:      Result Value   Clarity, UA cloudy (*)    All other components within normal limits  CBC WITH DIFFERENTIAL/PLATELET  COMPREHENSIVE METABOLIC PANEL    EKG  Radiology No results found.  Procedures Procedures (including critical care time)  Medications Ordered in UC Medications - No data to display  Initial Impression / Assessment and Plan / UC Course  I have reviewed the triage vital signs and the nursing notes.  Pertinent labs & imaging results that were available during my care of the patient were reviewed by me and considered in my medical decision making (see chart for details).  Vitals are stable.  Neuro exam intact. UA is unremarkable. CBC and CMP are normal.  Discussed possible etiologies with patient.  She has no red flags at this time.  Suspect a combination of medication side effect and other underlying illness/virus. Monitor symptoms and follow-up with primary care if persisting.  Reassuring she is feeling better today.   ED precautions for any acute worsening  Final Clinical Impressions(s) / UC Diagnoses   Final diagnoses:  Lightheadedness  Medication side effect  Discharge Instructions      Your urine sample looks good.  I will call you if anything abnormal results on your blood work.  I recommend to monitor your symptoms and keep drinking lots of fluids.  If at any point your symptoms worsen, go directly to the emergency department.  Otherwise contact your primary care provider for a follow-up visit.     ED Prescriptions   None    PDMP not reviewed this encounter.   Marlow Baars, [place] 11/15/22 1610

## 2022-11-15 NOTE — ED Triage Notes (Signed)
Sx started Thursday.   She's been taking mucinex, ran out took HT brand. Feels like she may have took a larger dosage. Hasn't taken anything since but is still lightheaded.

## 2022-11-15 NOTE — Discharge Instructions (Addendum)
Your urine sample looks good.  I will call you if anything abnormal results on your blood work.  I recommend to monitor your symptoms and keep drinking lots of fluids.  If at any point your symptoms worsen, go directly to the emergency department.  Otherwise contact your primary care provider for a follow-up visit.

## 2023-08-06 ENCOUNTER — Other Ambulatory Visit: Payer: Self-pay | Admitting: Physician Assistant

## 2023-08-06 DIAGNOSIS — Z1231 Encounter for screening mammogram for malignant neoplasm of breast: Secondary | ICD-10-CM

## 2023-08-14 ENCOUNTER — Ambulatory Visit

## 2023-08-21 ENCOUNTER — Ambulatory Visit

## 2023-09-04 ENCOUNTER — Ambulatory Visit
Admission: RE | Admit: 2023-09-04 | Discharge: 2023-09-04 | Disposition: A | Discharge status: Home / Self Care | Source: Ambulatory Visit | Attending: Physician Assistant | Admitting: Physician Assistant

## 2023-09-04 DIAGNOSIS — Z1231 Encounter for screening mammogram for malignant neoplasm of breast: Secondary | ICD-10-CM

## 2023-12-16 ENCOUNTER — Telehealth: Payer: Self-pay | Admitting: Gastroenterology

## 2023-12-16 NOTE — Telephone Encounter (Signed)
 Called patient due to her having a referral for a colonoscopy. Patient stated that she has had a colonoscopy before with Dr. Luis. Patient will try to obtain her colonoscopy report and pathology and have them faxed over to us . Please advise.
